# Patient Record
Sex: Female | Born: 1950 | Hispanic: No | Marital: Married | Smoking: Current some day smoker
Health system: Southern US, Community
[De-identification: ages and names within clinical notes are randomized; demographics above are authoritative.]

## PROBLEM LIST (undated history)

## (undated) DIAGNOSIS — F0390 Unspecified dementia without behavioral disturbance: Secondary | ICD-10-CM

## (undated) DIAGNOSIS — I10 Essential (primary) hypertension: Secondary | ICD-10-CM

---

## 2001-09-30 ENCOUNTER — Other Ambulatory Visit: Admission: RE | Admit: 2001-09-30 | Discharge: 2001-09-30 | Payer: Self-pay | Admitting: Family Medicine

## 2007-06-29 ENCOUNTER — Ambulatory Visit: Payer: Self-pay | Admitting: Family Medicine

## 2007-07-06 ENCOUNTER — Ambulatory Visit: Payer: Self-pay | Admitting: Family Medicine

## 2009-04-07 IMAGING — MG MM CAD SCREENING MAMMO
1 series · 5 of 5 positions shown · non-contrast
Comparison: none

REASON FOR EXAM: scr mammo
COMMENTS:

[Series 5434: R CC · right · 5 of 5 slices shown]
[im 1/5]
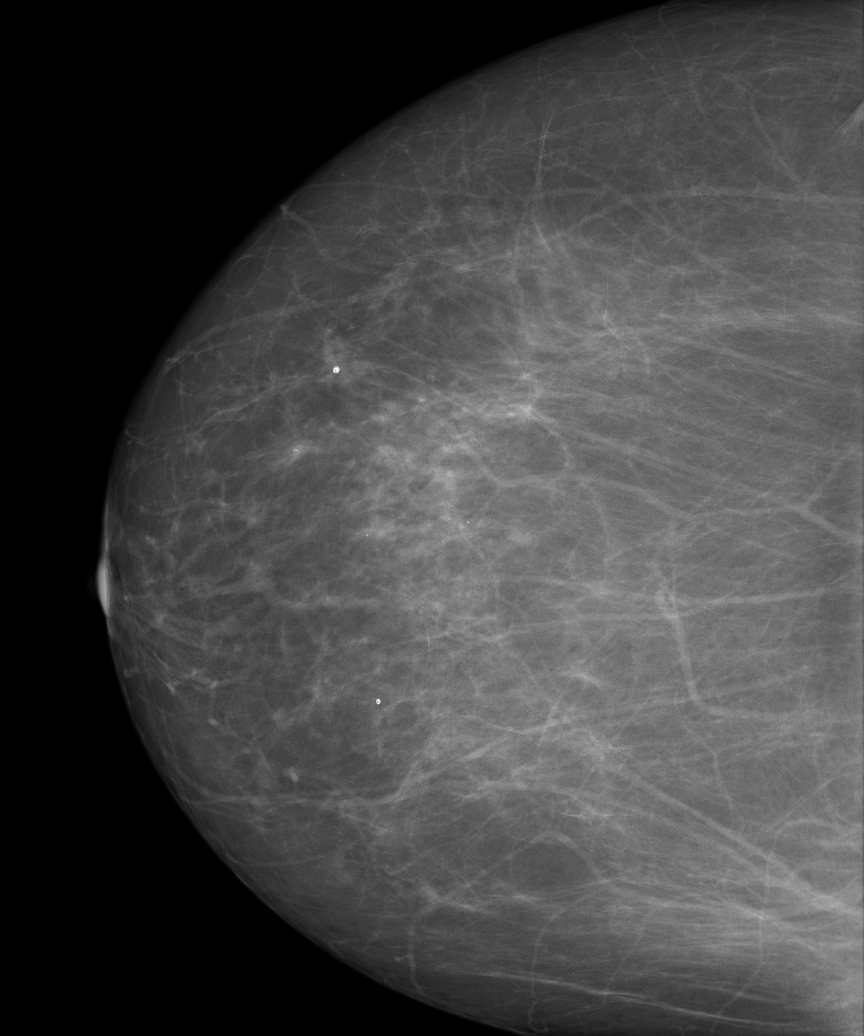
[im 2/5]
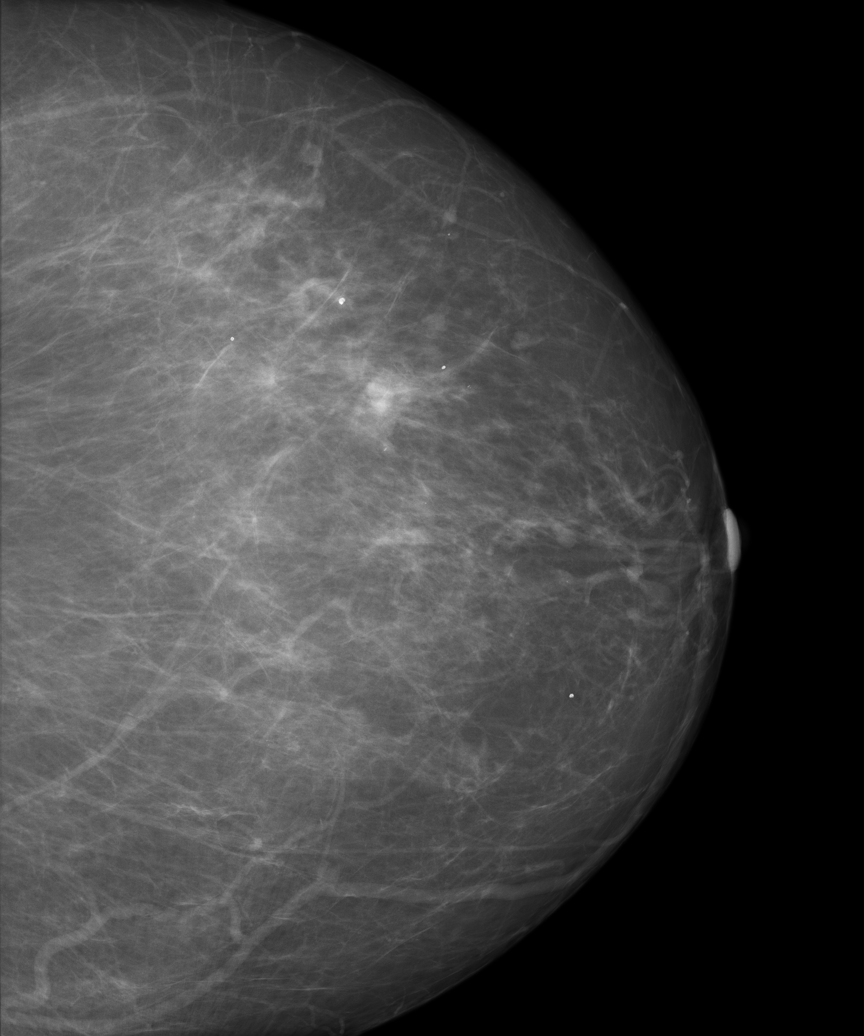
[im 3/5]
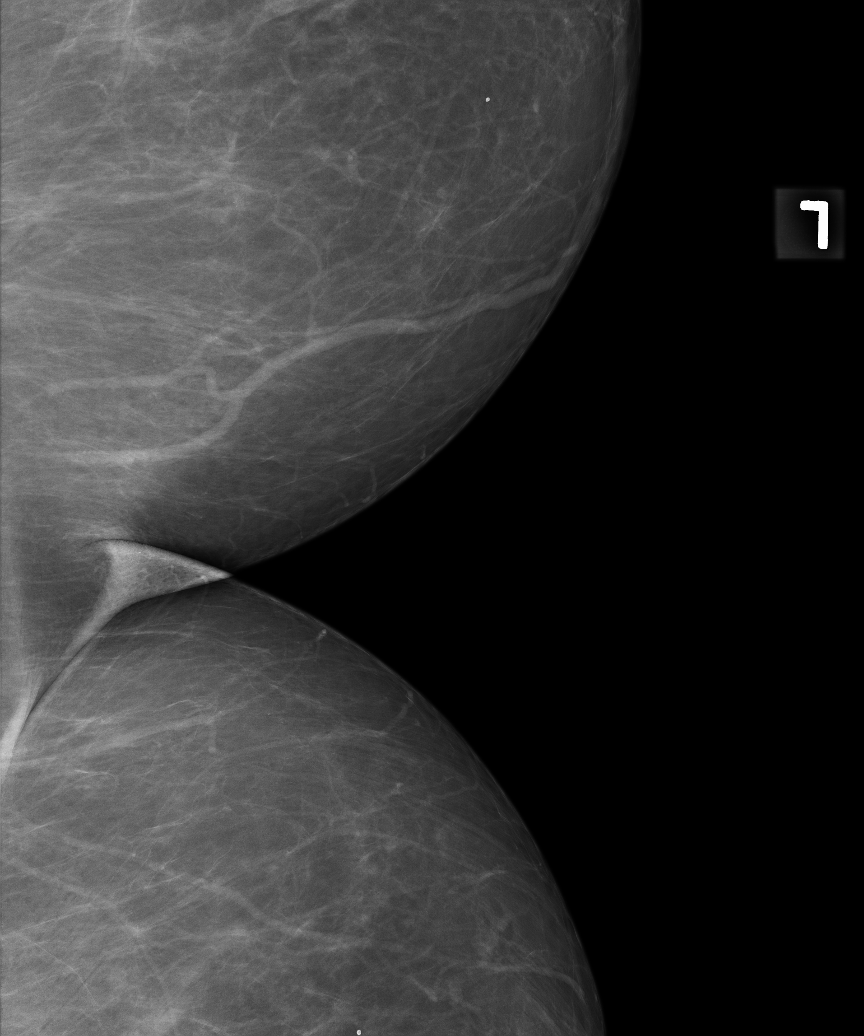
[im 4/5]
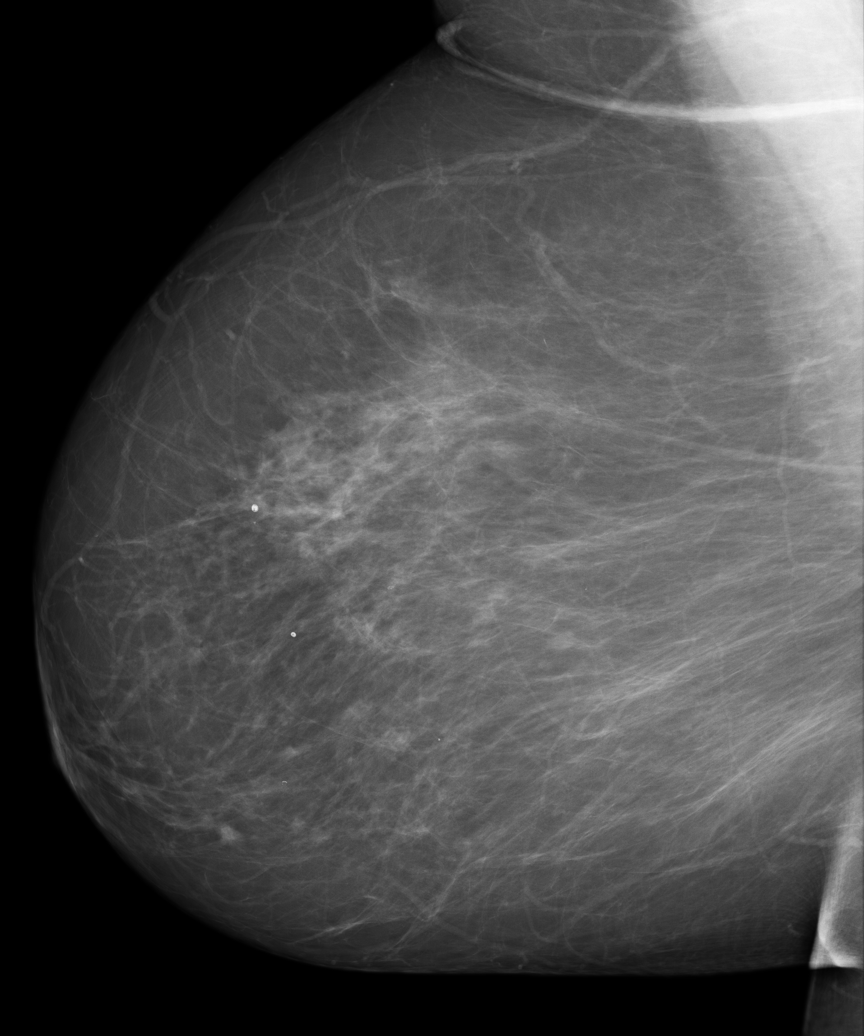
[im 5/5]
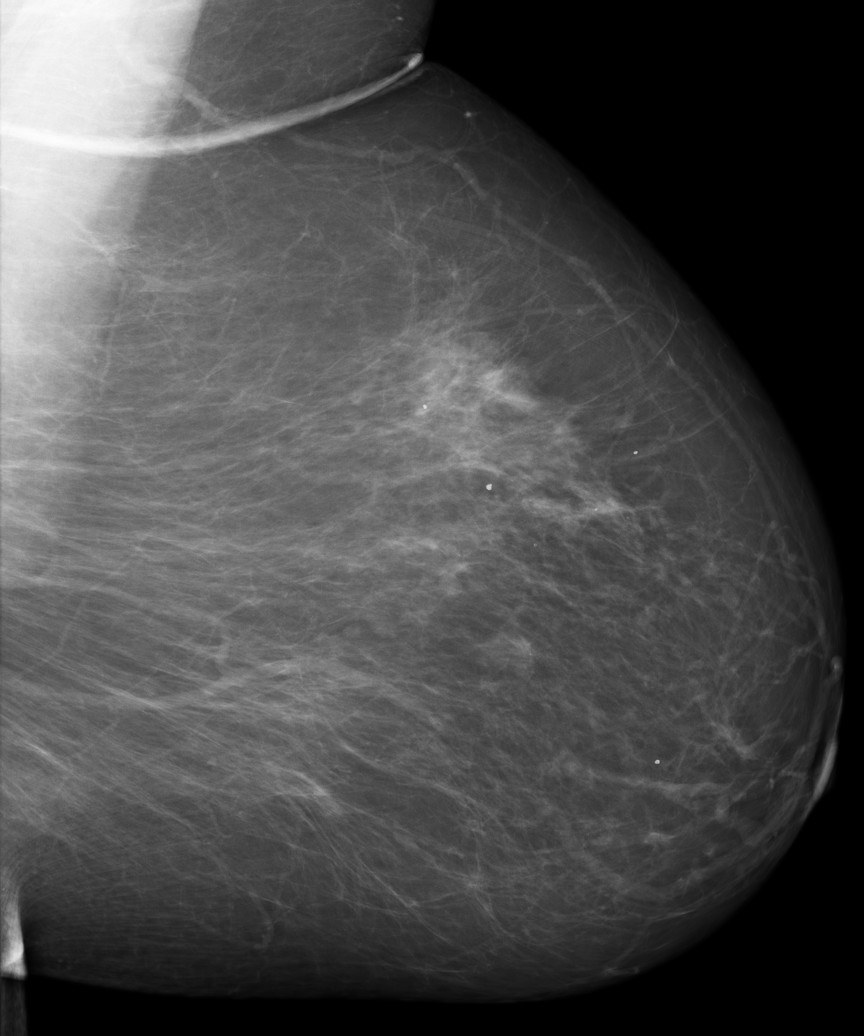

[5 of 5 positions shown; findings below may reference images not displayed]

PROCEDURE:     MAM - MAM DGTL SCREENING MAMMO W/CAD  - June 29, 2007  [DATE]

RESULT:     Comparison is made to prior studies dated 04-14-03 and 04-06-01.

The breasts demonstrate a mild heterogenous parenchymal pattern primarily
centrally within the breasts. An area of asymmetric, mild spiculated density
projects in the superolateral portion of the LEFT breast.  Scattered
calcifications are demonstrated throughout both breasts which appear stable
and benign. No further radiographic evidence to suggest malignancy is
appreciated.
IMPRESSION: 1)Asymmetric density, LEFT breast, additional imaging recommended.

BI-RADS: Category 0: Needs Additional Imaging Evaluation

A NEGATIVE MAMMOGRAM REPORT DOES NOT PRECLUDE BIOPSY OR OTHER EVALUATION OF
A CLINICALLY PALPABLE OR OTHERWISE SUSPICIOUS MASS OR LESION. BREAST CANCER
MAY NOT BE DETECTED BY MAMMOGRAPHY IN UP TO 10% OF CASES.

## 2009-11-17 ENCOUNTER — Ambulatory Visit: Payer: Self-pay | Admitting: Internal Medicine

## 2013-02-17 ENCOUNTER — Ambulatory Visit: Payer: Self-pay | Admitting: Family Medicine

## 2013-04-12 ENCOUNTER — Ambulatory Visit: Payer: Self-pay | Admitting: Family Medicine

## 2013-07-01 ENCOUNTER — Ambulatory Visit: Payer: Self-pay | Admitting: Unknown Physician Specialty

## 2013-07-02 LAB — PATHOLOGY REPORT

## 2013-12-07 ENCOUNTER — Ambulatory Visit: Payer: Self-pay | Admitting: Family Medicine

## 2014-10-19 ENCOUNTER — Other Ambulatory Visit: Payer: Self-pay | Admitting: Neurology

## 2014-10-19 DIAGNOSIS — R413 Other amnesia: Secondary | ICD-10-CM

## 2014-10-28 ENCOUNTER — Ambulatory Visit
Admission: RE | Admit: 2014-10-28 | Discharge: 2014-10-28 | Disposition: A | Discharge status: Home / Self Care | Payer: Medicare Other | Source: Ambulatory Visit | Attending: Neurology | Admitting: Neurology

## 2014-10-28 DIAGNOSIS — R413 Other amnesia: Secondary | ICD-10-CM

## 2014-10-28 DIAGNOSIS — G9389 Other specified disorders of brain: Secondary | ICD-10-CM | POA: Insufficient documentation

## 2015-01-20 IMAGING — MG MM ADDITIONAL VIEWS AT NO CHARGE
1 series · 5 of 5 positions shown · non-contrast
Comparison: With previous exams dated 11/17/2009 and [DATE]

CLINICAL DATA: Abnormal bilateral screening mammogram.

EXAM:
DIGITAL DIAGNOSTIC  BILATERL MAMMOGRAM

[R CC · right · 5 of 5 slices shown]
[im 1/5]
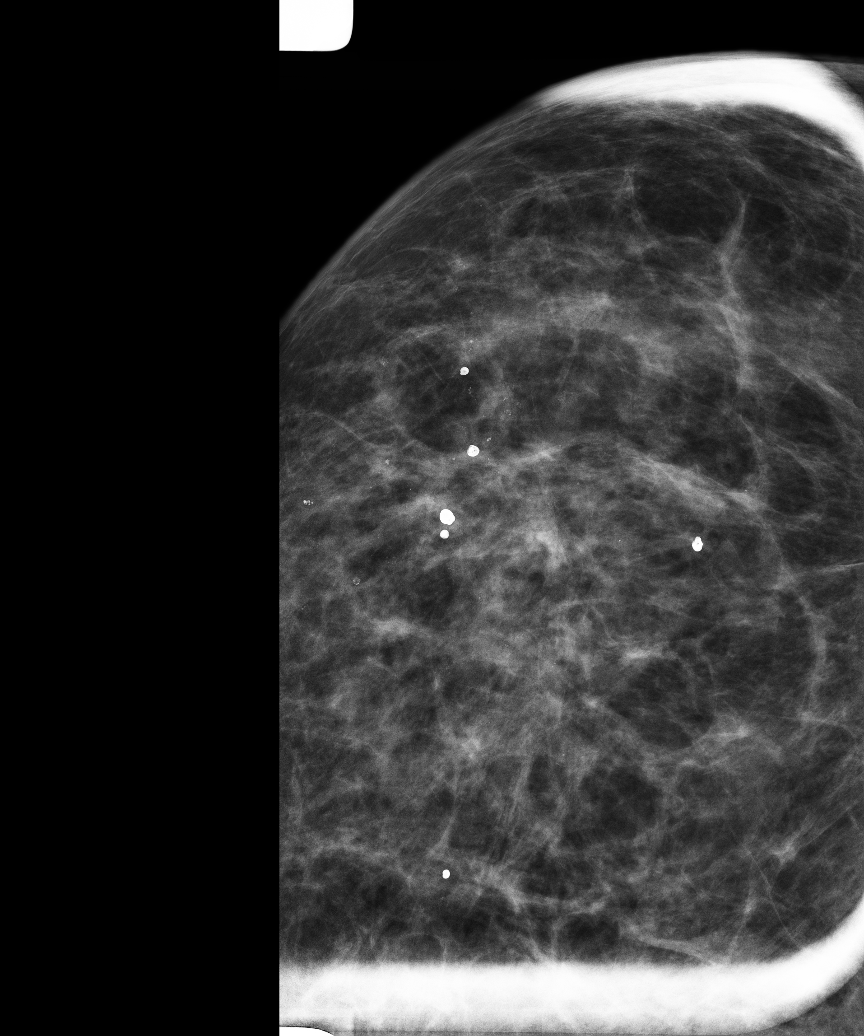
[im 2/5]
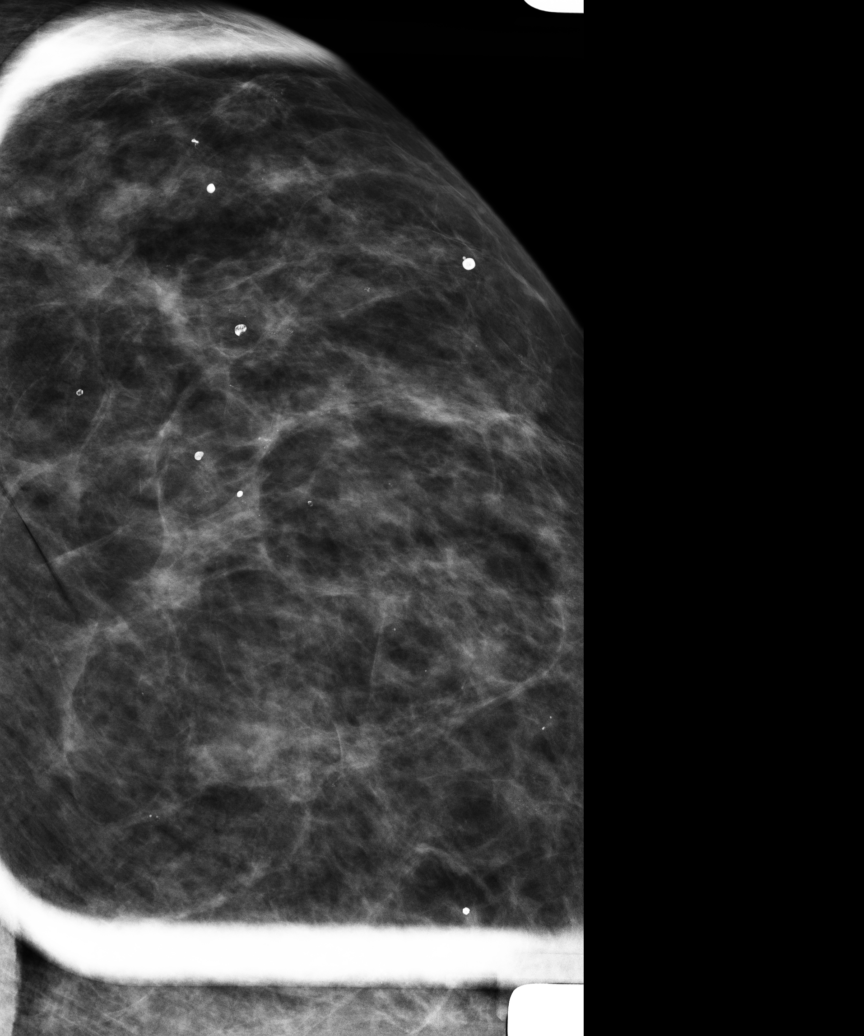
[im 3/5]
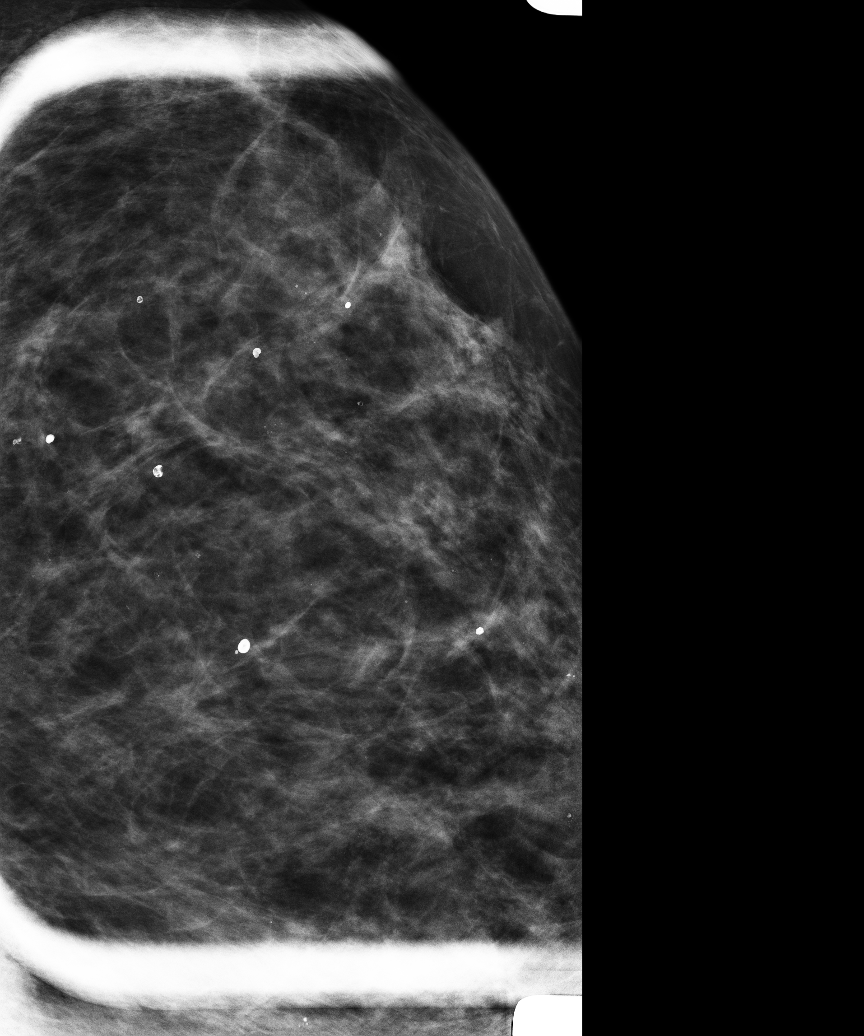
[im 4/5]
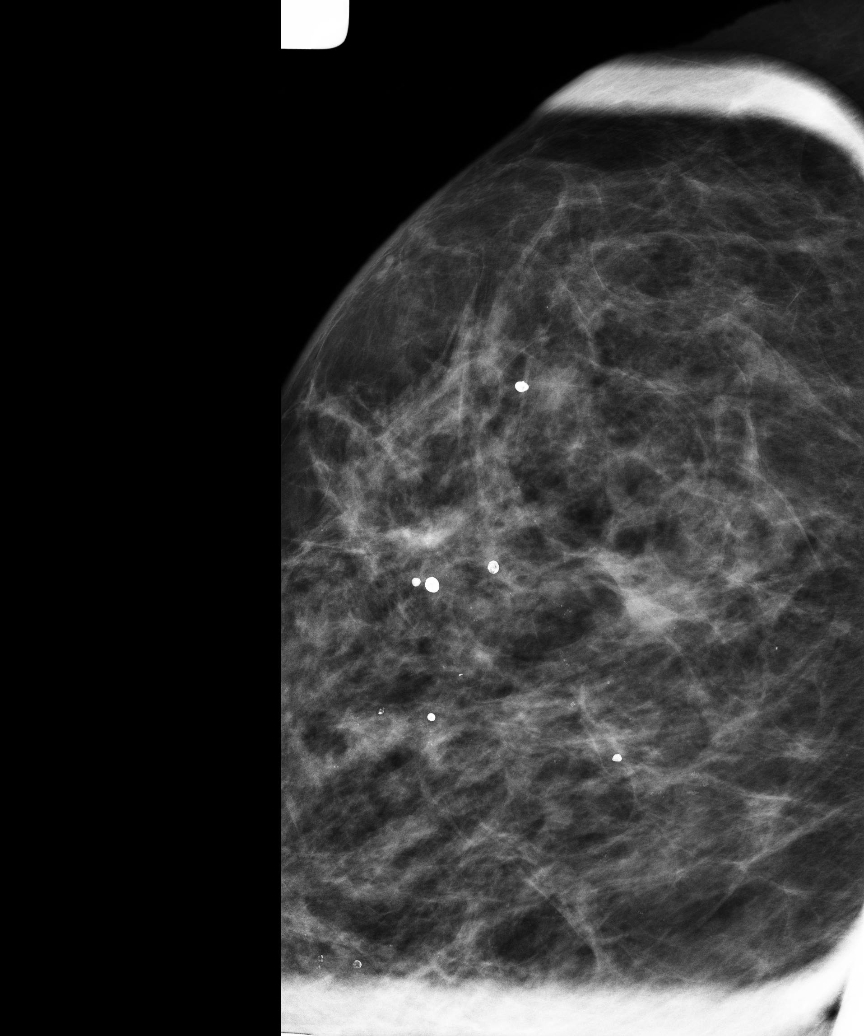
[im 5/5]
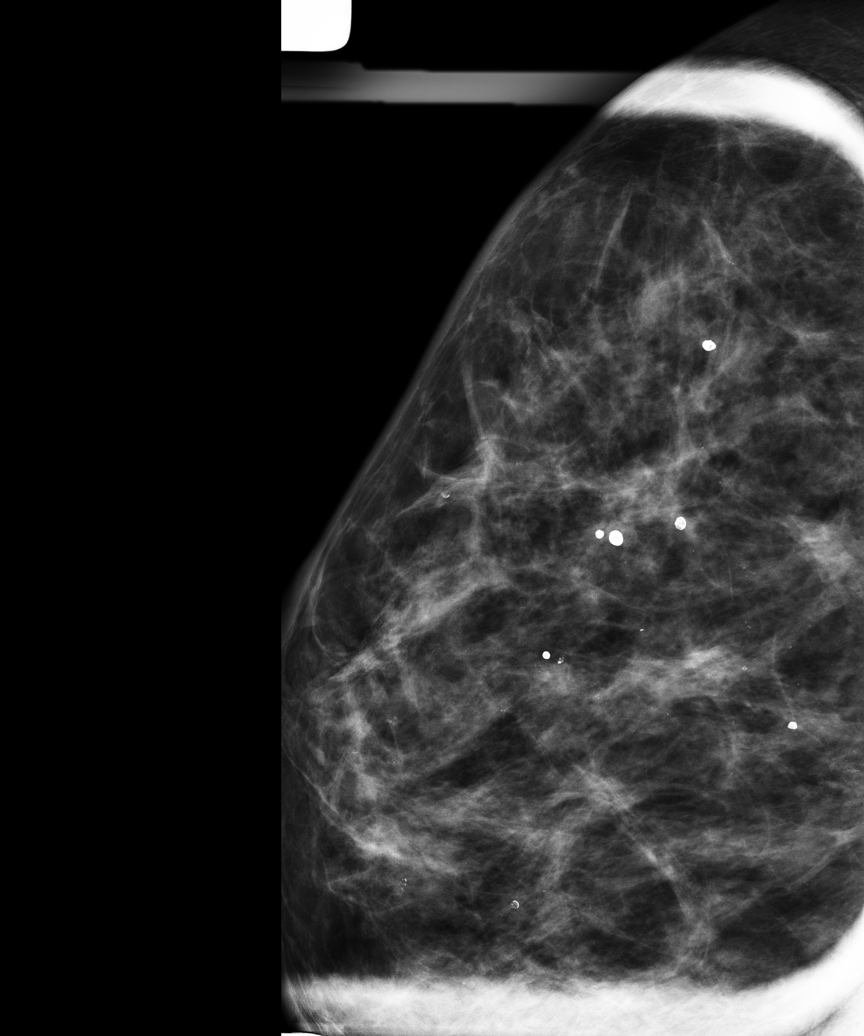

[5 of 5 positions shown; findings below may reference images not displayed]

ACR Breast Density Category b: There are scattered areas of
fibroglandular density.
FINDINGS: Magnification views of the upper-outer quadrant right breast were
performed. There are diffuse punctate calcifications. Most of these
are rounded and some have a differential appearance and may
represent milk of calcium. They are felt to be benign. There are
also diffuse calcifications in the upper-outer quadrant of the left
breast. Once again most of these are punctate and round. Some appear
to have a differential appearance on the lateral view and may also
represent milk of calcium. They are felt to likely be benign.
IMPRESSION: Probable benign calcifications in both breasts.

RECOMMENDATION:
Short-term interval followup mammogram in 6 months is recommended.

I have discussed the findings and recommendations with the patient.
Results were also provided in writing at the conclusion of the
visit. If applicable, a reminder letter will be sent to the patient
regarding the next appointment.

BI-RADS CATEGORY  3: Probably benign finding(s) - short interval
follow-up suggested.

## 2015-09-27 ENCOUNTER — Emergency Department: Payer: Medicare Other

## 2015-09-27 ENCOUNTER — Emergency Department
Admission: EM | Admit: 2015-09-27 | Discharge: 2015-09-27 | Disposition: A | Discharge status: Home / Self Care | Payer: Medicare Other | Attending: Student | Admitting: Student

## 2015-09-27 DIAGNOSIS — F0391 Unspecified dementia with behavioral disturbance: Secondary | ICD-10-CM | POA: Diagnosis not present

## 2015-09-27 DIAGNOSIS — R4182 Altered mental status, unspecified: Secondary | ICD-10-CM | POA: Diagnosis present

## 2015-09-27 DIAGNOSIS — R410 Disorientation, unspecified: Secondary | ICD-10-CM | POA: Insufficient documentation

## 2015-09-27 DIAGNOSIS — E876 Hypokalemia: Secondary | ICD-10-CM | POA: Insufficient documentation

## 2015-09-27 DIAGNOSIS — Z79899 Other long term (current) drug therapy: Secondary | ICD-10-CM | POA: Insufficient documentation

## 2015-09-27 DIAGNOSIS — E860 Dehydration: Secondary | ICD-10-CM | POA: Diagnosis not present

## 2015-09-27 LAB — COMPREHENSIVE METABOLIC PANEL
ALK PHOS: 56 U/L (ref 38–126)
AST: 16 U/L (ref 15–41)
Albumin: 3.8 g/dL (ref 3.5–5.0)
Anion gap: 9 (ref 5–15)
BUN: 13 mg/dL (ref 6–20)
CALCIUM: 8.9 mg/dL (ref 8.9–10.3)
CHLORIDE: 96 mmol/L — AB (ref 101–111)
CO2: 27 mmol/L (ref 22–32)
CREATININE: 1.17 mg/dL — AB (ref 0.44–1.00)
GFR calc non Af Amer: 47 mL/min — ABNORMAL LOW (ref >60)
GFR, EST AFRICAN AMERICAN: 55 mL/min — AB (ref >60)
Glucose, Bld: 126 mg/dL — ABNORMAL HIGH (ref 65–99)
Potassium: 2.9 mmol/L — CL (ref 3.5–5.1)
SODIUM: 132 mmol/L — AB (ref 135–145)
Total Bilirubin: 0.5 mg/dL (ref 0.3–1.2)
Total Protein: 7 g/dL (ref 6.5–8.1)

## 2015-09-27 LAB — CBC WITH DIFFERENTIAL/PLATELET
BASOS PCT: 3 %
Basophils Absolute: 0.3 10*3/uL — ABNORMAL HIGH (ref 0–0.1)
EOS ABS: 0.1 10*3/uL (ref 0–0.7)
EOS PCT: 1 %
HCT: 30.6 % — ABNORMAL LOW (ref 35.0–47.0)
HEMOGLOBIN: 10.3 g/dL — AB (ref 12.0–16.0)
LYMPHS ABS: 0.9 10*3/uL — AB (ref 1.0–3.6)
Lymphocytes Relative: 9 %
MCH: 27.2 pg (ref 26.0–34.0)
MCHC: 33.8 g/dL (ref 32.0–36.0)
MCV: 80.6 fL (ref 80.0–100.0)
MONO ABS: 0.2 10*3/uL (ref 0.2–0.9)
MONOS PCT: 2 %
NEUTROS PCT: 85 %
Neutro Abs: 8.7 10*3/uL — ABNORMAL HIGH (ref 1.4–6.5)
Platelets: 475 10*3/uL — ABNORMAL HIGH (ref 150–440)
RBC: 3.8 MIL/uL (ref 3.80–5.20)
RDW: 14.2 % (ref 11.5–14.5)
WBC: 10.2 10*3/uL (ref 3.6–11.0)

## 2015-09-27 LAB — URINALYSIS COMPLETE WITH MICROSCOPIC (ARMC ONLY)
BILIRUBIN URINE: NEGATIVE
Bacteria, UA: NONE SEEN
GLUCOSE, UA: NEGATIVE mg/dL
HGB URINE DIPSTICK: NEGATIVE
KETONES UR: NEGATIVE mg/dL
LEUKOCYTES UA: NEGATIVE
NITRITE: NEGATIVE
Protein, ur: NEGATIVE mg/dL
RBC / HPF: NONE SEEN RBC/hpf (ref 0–5)
Specific Gravity, Urine: 1.005 (ref 1.005–1.030)
pH: 6 (ref 5.0–8.0)

## 2015-09-27 LAB — TROPONIN I

## 2015-09-27 LAB — ETHANOL: Alcohol, Ethyl (B): <5 mg/dL (ref <5)

## 2015-09-27 LAB — GLUCOSE, CAPILLARY: Glucose-Capillary: 108 mg/dL — ABNORMAL HIGH (ref 65–99)

## 2015-09-27 MED ORDER — POTASSIUM CHLORIDE CRYS ER 20 MEQ PO TBCR
40.0000 meq | EXTENDED_RELEASE_TABLET | Freq: Once | ORAL | Status: AC
  Administered 2015-09-27: 40 meq via ORAL
  Filled 2015-09-27: qty 2

## 2015-09-27 MED ORDER — SODIUM CHLORIDE 0.9 % IV BOLUS (SEPSIS)
500.0000 mL | Freq: Once | INTRAVENOUS | Status: AC
  Administered 2015-09-27: 500 mL via INTRAVENOUS

## 2015-09-27 NOTE — ED Notes (Addendum)
Patient transported to X-ray and CT 

## 2015-09-27 NOTE — ED Provider Notes (Signed)
[facility] Emergency Department Provider Note  ____________________________________________  Time seen: Approximately 7:05 AM  I have reviewed the triage vital signs and the nursing notes.   HISTORY  Chief Complaint Altered Mental Status    HPI Holly Rocha is a 65 y.o. female with history of dementia, hypertension, hyperlipidemia who presents for evaluation of increased confusion since yesterday, gradual onset, intermittent, no modifying factors, mild. Patient's daughter is at bedside and provides the majority of the history. According to her daughter, patient  was seen by her primary care doctor yesterday and was told that she may been dehydrated based on some lab work that was performed. Last night, the patient was more confused, pacing the house, would not sleep. Patient has had cough but no fevers. She denies any chest pain or difficulty breathing. No vomiting, diarrhea, fevers or chills.    No past medical history on file.  There are no active problems to display for this patient.   No past surgical history on file.  Current Outpatient Rx  Name  Route  Sig  Dispense  Refill  . amLODipine (NORVASC) 5 MG tablet   Oral   Take 1 tablet by mouth daily.      1   . aspirin EC 81 MG tablet   Oral   Take 1 tablet by mouth daily.         Marland Kitchen donepezil (ARICEPT) 10 MG tablet   Oral   Take 1 tablet by mouth daily.      0   . losartan-hydrochlorothiazide (HYZAAR) 100-25 MG tablet   Oral   Take 1 tablet by mouth daily.      0   . lovastatin (MEVACOR) 40 MG tablet   Oral   Take 1 tablet by mouth daily with supper.      0     Allergies Penicillins  No family history on file.  Social History Social History  Substance Use Topics  . Smoking status: Not on file  . Smokeless tobacco: Not on file  . Alcohol Use: Not on file    Review of Systems Constitutional: No fever/chills Eyes: No visual changes. ENT: No sore  throat. Cardiovascular: Denies chest pain. Respiratory: Denies shortness of breath. Gastrointestinal: No abdominal pain.  No nausea, no vomiting.  No diarrhea.  No constipation. Genitourinary: Negative for dysuria. Musculoskeletal: Negative for back pain. Skin: Negative for rash. Neurological: Negative for headaches, focal weakness or numbness.  10-point ROS otherwise negative.  ____________________________________________   PHYSICAL EXAM:  VITAL SIGNS: ED Triage Vitals  Enc Vitals Group     BP 09/27/15 0637 155/60 mmHg     Pulse Rate 09/27/15 0637 83     Resp 09/27/15 0637 20     Temp 09/27/15 0637 98.1 F (36.7 C)     Temp Source 09/27/15 0637 Oral     SpO2 09/27/15 0637 100 %     Weight 09/27/15 0637 101 lb (45.813 kg)     Height 09/27/15 0637  (1.549 m)     Head Cir --      Peak Flow --      Pain Score --      Pain Loc --      Pain Edu? --      Excl. in GC? --     Constitutional: Alert and oriented x 3. Well appearing and in no acute distress. Eyes: Conjunctivae are normal. PERRL. EOMI. Head: Atraumatic. Nose: No congestion/rhinnorhea. Mouth/Throat: Mucous membranes are moist.  Oropharynx non-erythematous. Neck: No stridor.  Supple without meningismus. Cardiovascular: Normal rate, regular rhythm. Grossly normal heart sounds.  Good peripheral circulation. Respiratory: Normal respiratory effort.  No retractions. Lungs CTAB. Gastrointestinal: Soft and nontender. No distention. Marland Kitchen. No CVA tenderness. Genitourinary: deferred Musculoskeletal: No lower extremity tenderness nor edema.  No joint effusions. Neurologic:  Normal speech and language. No gross focal neurologic deficits are appreciated. No gait instability. 5 out of 5 strength in bilateral upper and lower extremities, sensation intact to light touch throughout, cranial nerves II through XII intact. Skin:  Skin is warm, dry and intact. No rash noted. Psychiatric: Mood and affect are normal. Speech and behavior  are normal.  ____________________________________________   LABS (all labs ordered are listed, but only abnormal results are displayed)  Labs Reviewed  COMPREHENSIVE METABOLIC PANEL - Abnormal; Notable for the following:    Sodium 132 (*)    Potassium 2.9 (*)    Chloride 96 (*)    Glucose, Bld 126 (*)    Creatinine, Ser 1.17 (*)    ALT <5 (*)    GFR calc non Af Amer 47 (*)    GFR calc Af Amer 55 (*)    All other components within normal limits  CBC WITH DIFFERENTIAL/PLATELET - Abnormal; Notable for the following:    Hemoglobin 10.3 (*)    HCT 30.6 (*)    Platelets 475 (*)    Neutro Abs 8.7 (*)    Lymphs Abs 0.9 (*)    Basophils Absolute 0.3 (*)    All other components within normal limits  GLUCOSE, CAPILLARY - Abnormal; Notable for the following:    Glucose-Capillary 108 (*)    All other components within normal limits  URINALYSIS COMPLETEWITH MICROSCOPIC ([facility] ONLY) - Abnormal; Notable for the following:    Color, Urine STRAW (*)    APPearance CLEAR (*)    Squamous Epithelial / LPF 0-5 (*)    All other components within normal limits  TROPONIN I  ETHANOL   ____________________________________________  EKG  ED ECG REPORT I, Gayla DossGayle, Helton Oleson A, the attending physician, personally viewed and interpreted this ECG.   Date: 09/27/2015  EKG Time: 07:52  Rate: 75  Rhythm:Sinus rhythm with borderline and prolonged PR interval/first-degree AV block  Axis: normal  Intervals:none  ST&T Change: No acute ST elevation.  ____________________________________________  RADIOLOGY  CT head IMPRESSION: Mild atrophy and microvascular ischemic disease without acute intracranial process.   CXR IMPRESSION: No active cardiopulmonary disease. ____________________________________________   PROCEDURES  Procedure(s) performed: None  Critical Care performed: No  ____________________________________________   INITIAL IMPRESSION / ASSESSMENT AND PLAN / ED COURSE  Pertinent  labs & imaging results that were available during my care of the patient were reviewed by me and considered in my medical decision making (see chart for details).  Holly Rocha is a 65 y.o. female with history of dementia, hypertension, hyperlipidemia who presents for evaluation of increased confusion since yesterday. On exam, she is very well-appearing and in no acute distress. Vital signs stable, she is afebrile. She has a benign physical examination as well as an intact neurological examination. She is alert and oriented 3 but not situation however daughter reports that this is her baseline mental status. We'll obtain screening labs, EKG, chest x-ray, CT head, urinalysis. Will give light fluid bolus. Reassess for disposition.  ----------------------------------------- 9:05 AM on 09/27/2015 ----------------------------------------- Labs reviewed. CBC with mild anemia, hemoglobin 10.3 however this appears to be her baseline. CMP with mild hyponatremia, hypokalemia. Sodium 132,  potassium 2.9, creatinine also mildly elevated at 1.17, suspected symptoms may be related to mild dehydration superimposed on her chronic dementia. She received an IV fluid bolus and potassium was repleted orally. Troponin negative. Urinalysis is not consistent with infection. CT head and chest x-ray showed no acute abnormality. Discussed the need for increased oral fluid intake. We also discussed meticulous return precautions, need for close PCP follow-up within the next 1-2 days for recheck of potassium and creatinine. Her daughter at bedside voices understanding and is comfortable with the discharge plan. DC home.  ____________________________________________   FINAL CLINICAL IMPRESSION(S) / ED DIAGNOSES  Final diagnoses:  Confusion  Dehydration  Hypokalemia  Dementia, with behavioral disturbance      Gayla Doss, MD 09/27/15 [phone]

## 2015-09-27 NOTE — Discharge Instructions (Signed)
As we discussed, Holly Rocha needs to follow-up with her primary care doctor within the next 1-2 days for recheck of her potassium, sodium and kidney function labs. Please call his office to schedule an appointment for her to be seen. She should return immediately to the emergency department if she has significant worsening of confusion, dizziness, headache, vomiting, diarrhea, fevers, chills, numbness, weakness, chest pain, difficulty breathing, or for any other concerns.

## 2015-09-27 NOTE — ED Notes (Signed)
Patient ambulatory to triage with steady gait, without difficulty or distress noted; pt accomp by daughter who reports increased confusion; hx alzheimer's; seen PCP yesterday who st ?dehydration

## 2016-01-02 ENCOUNTER — Emergency Department: Payer: Medicare Other

## 2016-01-02 ENCOUNTER — Encounter: Payer: Self-pay | Admitting: Emergency Medicine

## 2016-01-02 ENCOUNTER — Emergency Department
Admission: EM | Admit: 2016-01-02 | Discharge: 2016-01-02 | Disposition: A | Discharge status: Home / Self Care | Payer: Medicare Other | Attending: Emergency Medicine | Admitting: Emergency Medicine

## 2016-01-02 DIAGNOSIS — R2242 Localized swelling, mass and lump, left lower limb: Secondary | ICD-10-CM | POA: Diagnosis present

## 2016-01-02 DIAGNOSIS — L03116 Cellulitis of left lower limb: Secondary | ICD-10-CM | POA: Insufficient documentation

## 2016-01-02 DIAGNOSIS — F17200 Nicotine dependence, unspecified, uncomplicated: Secondary | ICD-10-CM | POA: Diagnosis not present

## 2016-01-02 DIAGNOSIS — Z7982 Long term (current) use of aspirin: Secondary | ICD-10-CM | POA: Insufficient documentation

## 2016-01-02 DIAGNOSIS — I10 Essential (primary) hypertension: Secondary | ICD-10-CM | POA: Diagnosis not present

## 2016-01-02 HISTORY — DX: Essential (primary) hypertension: I10

## 2016-01-02 HISTORY — DX: Unspecified dementia, unspecified severity, without behavioral disturbance, psychotic disturbance, mood disturbance, and anxiety: F03.90

## 2016-01-02 MED ORDER — CLINDAMYCIN HCL 300 MG PO CAPS: 300.0000 mg | ORAL_CAPSULE | Freq: Four times a day (QID) | ORAL | Status: DC

## 2016-01-02 NOTE — Discharge Instructions (Signed)
Please seek medical attention for any high fevers, chest pain, shortness of breath, change in behavior, persistent vomiting, bloody stool or any other new or concerning symptoms. ° ° °Cellulitis °Cellulitis is an infection of the skin and the tissue under the skin. The infected area is usually red and tender. This happens most often in the arms and lower legs. °HOME CARE  °· Take your antibiotic medicine as told. Finish the medicine even if you start to feel better. °· Keep the infected arm or leg raised (elevated). °· Put a warm cloth on the area up to 4 times per day. °· Only take medicines as told by your doctor. °· Keep all doctor visits as told. °GET HELP IF: °· You see red streaks on the skin coming from the infected area. °· Your red area gets bigger or turns a dark color. °· Your bone or joint under the infected area is painful after the skin heals. °· Your infection comes back in the same area or different area. °· You have a puffy (swollen) bump in the infected area. °· You have new symptoms. °· You have a fever. °GET HELP RIGHT AWAY IF:  °· You feel very sleepy. °· You throw up (vomit) or have watery poop (diarrhea). °· You feel sick and have muscle aches and pains. °  °This information is not intended to replace advice given to you by your health care provider. Make sure you discuss any questions you have with your health care provider. °  °Document Released: 11/20/2007 Document Revised: 02/22/2015 Document Reviewed: 08/19/2011 °Elsevier Interactive Patient Education ©2016 [employer] ° °

## 2016-01-02 NOTE — ED Notes (Signed)
Pt with left foot swelling since Saturday. Pt denies any other sx at this time.

## 2016-01-02 NOTE — ED Provider Notes (Signed)
[facility] Emergency Department Provider Note    ____________________________________________  Time seen: ~1730  I have reviewed the triage vital signs and the nursing notes.   HISTORY  Chief Complaint Leg Swelling   History limited by: Not Limited   HPI Holly Rocha is a 65 y.o. female who presents to the emergency department today because of concerns for left foot swelling, redness and warmth. She noticed these symptoms yesterday. She has had some very mild pain associated with the left fifth digit. She has noticed some swelling in both shins. She denies any trauma to her foot. Denies any crush injury. Denies similar symptoms in the past. The patient says that she has not had any fevers, nausea vomiting, chest pain or palpitation.   Past Medical History  Diagnosis Date  . Hypertension   . Dementia     There are no active problems to display for this patient.   History reviewed. No pertinent past surgical history.  Current Outpatient Rx  Name  Route  Sig  Dispense  Refill  . amLODipine (NORVASC) 5 MG tablet   Oral   Take 1 tablet by mouth daily.      1   . aspirin EC 81 MG tablet   Oral   Take 1 tablet by mouth daily.         Marland Kitchen. donepezil (ARICEPT) 10 MG tablet   Oral   Take 1 tablet by mouth daily.      0   . losartan-hydrochlorothiazide (HYZAAR) 100-25 MG tablet   Oral   Take 1 tablet by mouth daily.      0   . lovastatin (MEVACOR) 40 MG tablet   Oral   Take 1 tablet by mouth daily with supper.      0     Allergies Penicillins  No family history on file.  Social History Social History  Substance Use Topics  . Smoking status: Current Some Day Smoker  . Smokeless tobacco: None  . Alcohol Use: No    Review of Systems  Constitutional: Negative for fever. Cardiovascular: Negative for chest pain. Respiratory: Negative for shortness of breath. Gastrointestinal: Negative for abdominal pain, vomiting and  diarrhea. Neurological: Negative for headaches, focal weakness or numbness.  10-point ROS otherwise negative.  ____________________________________________   PHYSICAL EXAM:  VITAL SIGNS: ED Triage Vitals  Enc Vitals Rocha     BP 01/02/16 1616 173/46 mmHg     Pulse Rate 01/02/16 1616 76     Resp 01/02/16 1616 18     Temp 01/02/16 1616 98.4 F (36.9 C)     Temp Source 01/02/16 1616 Oral     SpO2 01/02/16 1616 100 %   Constitutional: Alert and oriented. Well appearing and in no distress. Eyes: Conjunctivae are normal. PERRL. Normal extraocular movements. ENT   Head: Normocephalic and atraumatic.   Nose: No congestion/rhinnorhea.   Mouth/Throat: Mucous membranes are moist.   Neck: No stridor. Hematological/Lymphatic/Immunilogical: No cervical lymphadenopathy. Cardiovascular: Normal rate, regular rhythm.  No murmurs, rubs, or gallops. Respiratory: Normal respiratory effort without tachypnea nor retractions. Breath sounds are clear and equal bilaterally. No wheezes/rales/rhonchi. Gastrointestinal: Soft and nontender. No distention. There is no CVA tenderness. Genitourinary: Deferred Musculoskeletal: Normal range of motion in all extremities. No joint effusions.  Bilateral trace pitting edema. The left foot does have more swelling with erythema overlying the dorsal aspect. It is warm to touch. Neurologic:  Normal speech and language. No gross focal neurologic deficits are appreciated.  Skin:  Skin is warm, dry and intact. No rash noted. Psychiatric: Mood and affect are normal. Speech and behavior are normal. Patient exhibits appropriate insight and judgment.  ____________________________________________    LABS (pertinent positives/negatives)  None  ____________________________________________   EKG  None  ____________________________________________    RADIOLOGY  Left foot IMPRESSION: Mild to moderate soft tissue swelling at the left ankle and  midfoot. No fracture, dislocation or advanced arthropathy.  ____________________________________________   PROCEDURES  Procedure(s) performed: None  Critical Care performed: No  ____________________________________________   INITIAL IMPRESSION / ASSESSMENT AND PLAN / ED COURSE  Pertinent labs & imaging results that were available during my care of the patient were reviewed by me and considered in my medical decision making (see chart for details).  Patient presents to the emergency family today because of concerns for left foot swelling, redness and warmth. On exam the foot does have erythema and warmth to it. X-ray was obtained which did not show any acute abnormality. Will plan on discharging home and treating for cellulitis. In terms of the trace edema patient was taken off her hydrochlorothiazide. Will have patient discussed with PCP.  ____________________________________________   FINAL CLINICAL IMPRESSION(S) / ED DIAGNOSES  Final diagnoses:  Cellulitis of left lower extremity     Note: This dictation was prepared with Dragon dictation. Any transcriptional errors that result from this process are unintentional    Phineas Semen, MD 01/02/16 1904
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
## 2016-05-29 NOTE — Progress Notes (Signed)
 Established Patient Visit   Chief Complaint: Chief Complaint  Patient presents with  . Follow-up    4 mo  . Hypertension  . Hyperlipidemia  . Cardiac Valve Problem  . Chronic Kidney Disease  . Nicotine Dependence   Date of Service: 05/29/2016 Date of Birth: 06-May-1951 PCP: JERONA DARILYN SAYRE, MD  History of Present Illness: [name] is a 65 y.o.female patient who comes in for follow-up, accompanied by her daughter, and she states that she has continued with her usual physical activities, with no episodes of chest pain, dyspnea or other cardiovascular symptoms at this time.  Her weight is up an additional 10 pounds, and she attributes this to dietary indiscretion as well as less walking, and we discussed the importance of a regular walking program as well as DASH diet to avoid further weight gain.  Her blood pressure is elevated today, and upon recheck it remains elevated.  For this reason we will increase amlodipine to 5 mg daily, and she and her daughter have agreed to monitor blood pressure readings closely at home.  She is also scheduled to see her PCP within the next 2 weeks.  She has continued with lovastatin for dyslipidemia, with recent fasting lipid profile showing total cholesterol 225, HDL 85, LDL 129, with liver enzymes within normal limits, and again her increase in total cholesterol and LDL are likely due to dietary indiscretion.  His recent creatinine is 1.5, and we discussed the importance of avoiding nephrotoxic agents such as NSAIDs, and the patient and her daughter verbalized understanding.  Echocardiogram performed in 2015 has shown normal LV function with EF 45-50% and moderate aortic regurgitation with mild aortic stenosis, and mild mitral, tricuspid and pulmonic regurgitation.  Past Medical and Surgical History  Past Medical History Past Medical History:  Diagnosis Date  . Arthritis   . B12 deficiency   . Chronic kidney disease   . Dementia   . Encephalomalacia    . Heart murmur, unspecified   . Hyperlipidemia   . Hypertension   . Migraines   . Mild aortic stenosis     Past Surgical History She has no past surgical history on file.   Medications and Allergies  Current Medications  Current Outpatient Prescriptions  Medication Sig Dispense Refill  . amLODIPine (NORVASC) 2.5 MG tablet Take 2 tablets (5 mg total) by mouth once daily.    SABRA aspirin 81 MG EC tablet Take 81 mg by mouth once daily.    . carvedilol (COREG) 3.125 MG tablet Take 1 tablet (3.125 mg total) by mouth 2 (two) times daily with meals. 60 tablet 11  . cyanocobalamin (VITAMIN B12) 1000 MCG tablet Take 1,000 mcg by mouth once daily.    SABRA donepezil (ARICEPT) 10 MG tablet Take 1 tablet (10 mg total) by mouth nightly. 30 tablet 11  . losartan (COZAAR) 100 MG tablet Take 1 tablet (100 mg total) by mouth once daily. 90 tablet 3  . lovastatin (MEVACOR) 40 MG tablet take 1 tablet by mouth once daily with dinner 90 tablet 3   No current facility-administered medications for this visit.     Allergies: Penicillins; Ace inhibitors; Codeine phosphate (bulk); Lisinopril (bulk); and Penicillin g pot in dextrose  Social and Family History  Social History  reports that she has been smoking Cigarettes.  She started smoking about 37 years ago. She has a 20.00 pack-year smoking history. She has never used smokeless tobacco. She reports that she does not drink alcohol or use drugs.  Family History Family History  Problem Relation Age of Onset  . Coronary Artery Disease (Blocked arteries around heart) Mother   . Alzheimer's disease Father   . Dementia Father   . Alzheimer's disease Sister   . Dementia Sister   . Hyperlipidemia (Elevated cholesterol) Brother   . High blood pressure (Hypertension) Brother     Review of Systems   Review of Systems: Positive for 10 pounds weight gain, no weakness or fatigue, vision change or hearing loss, cough or congestion, nausea or vomiting, diarrhea,  melena or stomach pain, leg weakness or pain, leg blood clots or cramping, headache or blackouts, dizziness, trouble swallowing, skin rashes or sores, muscle weakness or numbness, anxiety or depression  Physical Examination   Vitals:BP 160/80   Pulse 80   Resp 14   Ht 154.9 cm (5' 1)   Wt 59 kg (130 lb)   LMP  (LMP Unknown)   BMI 24.56 kg/m  Ht:154.9 cm (5' 1) Wt:59 kg (130 lb) ADJ:Anib surface area is 1.59 meters squared. Body mass index is 24.56 kg/m. Appearance: well appearing in no acute distress HEENT: Pupils equally reactive to light and accomodation without xantholasma  Neck: Supple without thyromegaly  Lungs: clear to auscultation bilaterally; no wheezes, rales, rhonchi Heart: Regular rate and rhythm. Normal S1 S2, with a grade 3/6 murmur heard throughout.  No gallop or rub, PMI in normal place and size. carotid upstroke normal without bruit. Jugular venous pressure is normal Abdomen: soft nontender, nondistended, with normal bowel sounds. Abdominal aorta is normal size without bruit Extremities: no cyanosis, clubbing, or edema Peripheral Pulses: 2+ in all extremities, 2+ femoral pulses bilaterally Musculoskeletal;  Normal muscle tone without kyphosis Neurological:Oriented to time, place, and person with normal mood and affect  Assessment   65 y.o. female with  1. Benign essential hypertension   2. Mild aortic stenosis   3. Moderate mitral insufficiency   4. CKD (chronic kidney disease) stage 3, GFR 30-59 ml/min   5. Hyperlipidemia, mixed   6. SOB (shortness of breath)    Now with elevated blood pressure, needing further treatment options.     Plan  -Continue current medical regimen for hypertension control, will increase amlodipine to 5 mg daily for goal blood pressure of 140/90 or less. -The patient and her daughter have agreed to monitor blood pressure readings closely at home, and the patient is also scheduled to see her PCP within the next 2 weeks. -We had a  long discussion regarding the importance of adhering to DASH diet for blood pressure control as well as weight control. -We have had a long discussion and recommendation of an exercise program including minimal activity on a daily basis between 3-5 times per week.  We have suggested walking as that the easiest planned to implement.  The patient understands and agrees to begin working on this at this time  -Continue moderate to high intensive cholesterol therapy for further future risk reduction in cardiovascular disease  -Continue to monitor kidney function closely, and patient was counseled on the importance of avoiding nephrotoxic agents such as NSAIDs. -No further cardiac intervention of valvular heart disease on appropriate medication management and clinically stable at this time    No orders of the defined types were placed in this encounter.   Return in about 3 months (around 08/27/2016).  BONNIE   JEAN MELVILLE, NP

## 2016-12-19 ENCOUNTER — Other Ambulatory Visit: Payer: Self-pay | Admitting: Family Medicine

## 2016-12-19 DIAGNOSIS — R921 Mammographic calcification found on diagnostic imaging of breast: Secondary | ICD-10-CM
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
## 2017-01-30 ENCOUNTER — Ambulatory Visit
Admission: RE | Admit: 2017-01-30 | Discharge: 2017-01-30 | Disposition: A | Discharge status: Home / Self Care | Payer: Medicare Other | Source: Ambulatory Visit | Attending: Family Medicine | Admitting: Family Medicine

## 2017-01-30 DIAGNOSIS — N6002 Solitary cyst of left breast: Secondary | ICD-10-CM | POA: Diagnosis not present

## 2017-01-30 DIAGNOSIS — R921 Mammographic calcification found on diagnostic imaging of breast: Secondary | ICD-10-CM

## 2018-05-26 ENCOUNTER — Other Ambulatory Visit: Payer: Self-pay | Admitting: Family

## 2018-05-26 DIAGNOSIS — Z1231 Encounter for screening mammogram for malignant neoplasm of breast: Secondary | ICD-10-CM

## 2018-06-16 ENCOUNTER — Other Ambulatory Visit: Payer: Self-pay | Admitting: Family

## 2018-06-16 ENCOUNTER — Ambulatory Visit
Admission: RE | Admit: 2018-06-16 | Discharge: 2018-06-16 | Disposition: A | Discharge status: Home / Self Care | Payer: No Typology Code available for payment source | Source: Ambulatory Visit | Attending: Family | Admitting: Family

## 2018-06-16 DIAGNOSIS — Z1231 Encounter for screening mammogram for malignant neoplasm of breast: Secondary | ICD-10-CM | POA: Insufficient documentation

## 2019-08-02 ENCOUNTER — Other Ambulatory Visit: Payer: Self-pay | Admitting: Family

## 2019-08-02 DIAGNOSIS — Z1231 Encounter for screening mammogram for malignant neoplasm of breast: Secondary | ICD-10-CM

## 2020-07-05 ENCOUNTER — Other Ambulatory Visit: Payer: Self-pay

## 2020-07-05 ENCOUNTER — Ambulatory Visit
Admission: RE | Admit: 2020-07-05 | Discharge: 2020-07-05 | Disposition: A | Discharge status: Home / Self Care | Payer: Medicare (Managed Care) | Source: Ambulatory Visit | Attending: Family | Admitting: Family

## 2020-07-05 DIAGNOSIS — Z1231 Encounter for screening mammogram for malignant neoplasm of breast: Secondary | ICD-10-CM | POA: Insufficient documentation

## 2020-08-21 ENCOUNTER — Other Ambulatory Visit: Payer: Self-pay | Admitting: Family

## 2020-08-21 DIAGNOSIS — Z1231 Encounter for screening mammogram for malignant neoplasm of breast: Secondary | ICD-10-CM

## 2022-04-14 IMAGING — MG DIGITAL SCREENING BILAT W/ TOMO W/ CAD
8 of 14 series · 8 of 40 positions shown · non-contrast
Comparison: Previous exam(s).

CLINICAL DATA: Screening.

EXAM:
DIGITAL SCREENING BILATERAL MAMMOGRAM WITH TOMO AND CAD

[L CC synth-2D]
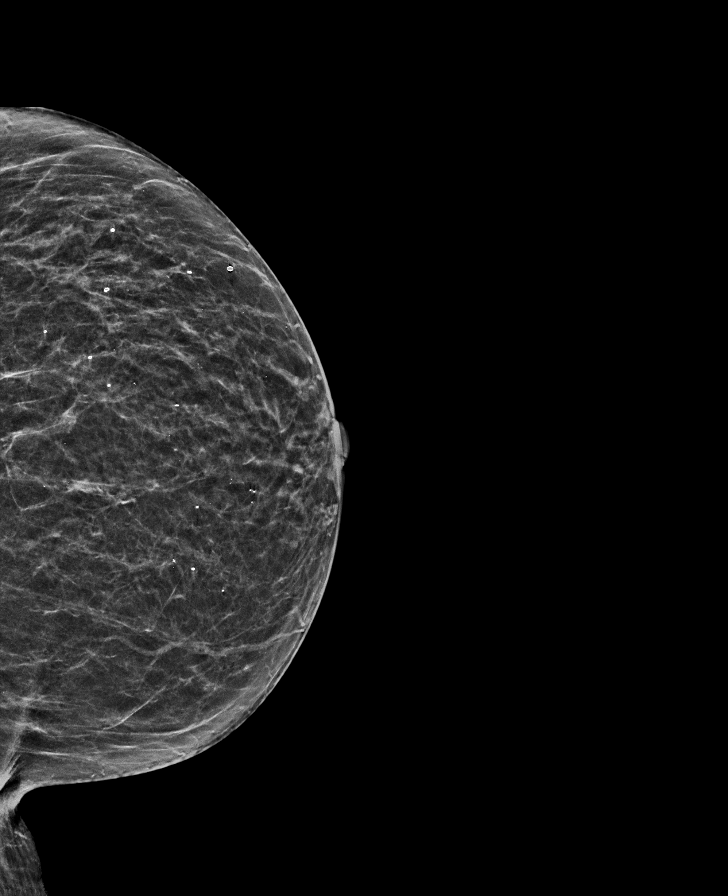

[L MLO synth-2D (1 of 2)]
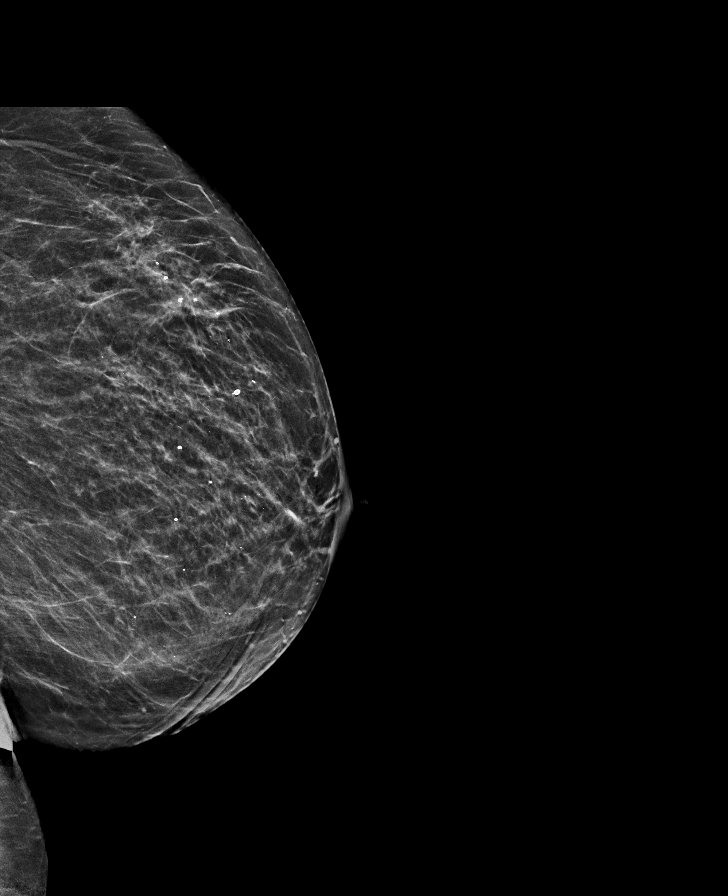

[R CC synth-2D (1 of 2)]
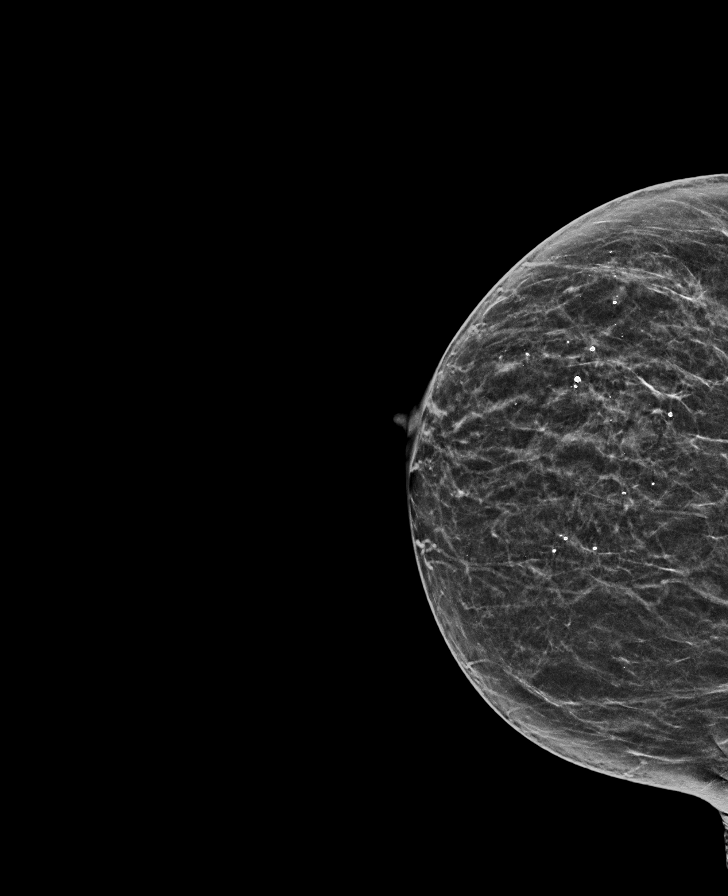

[R MLO synth-2D (1 of 2)]
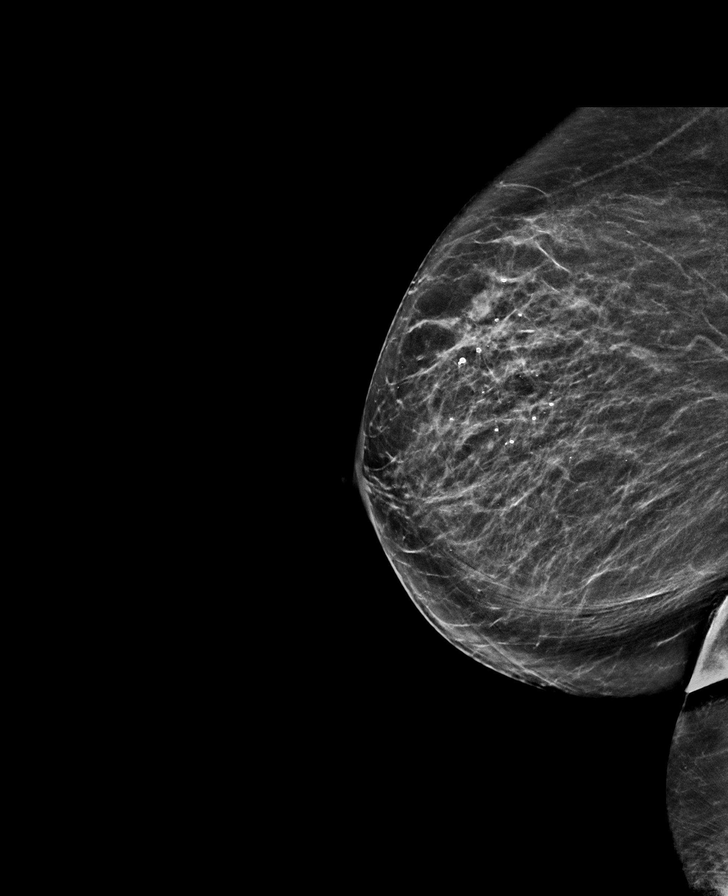

[L MLO synth-2D (2 of 2)]
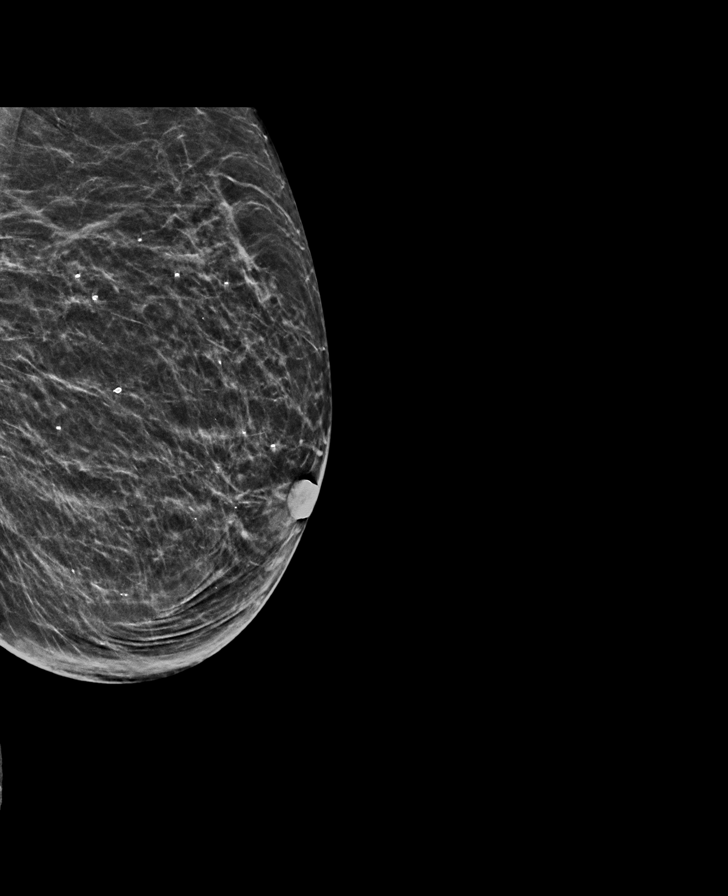

[R CC synth-2D (2 of 2)]
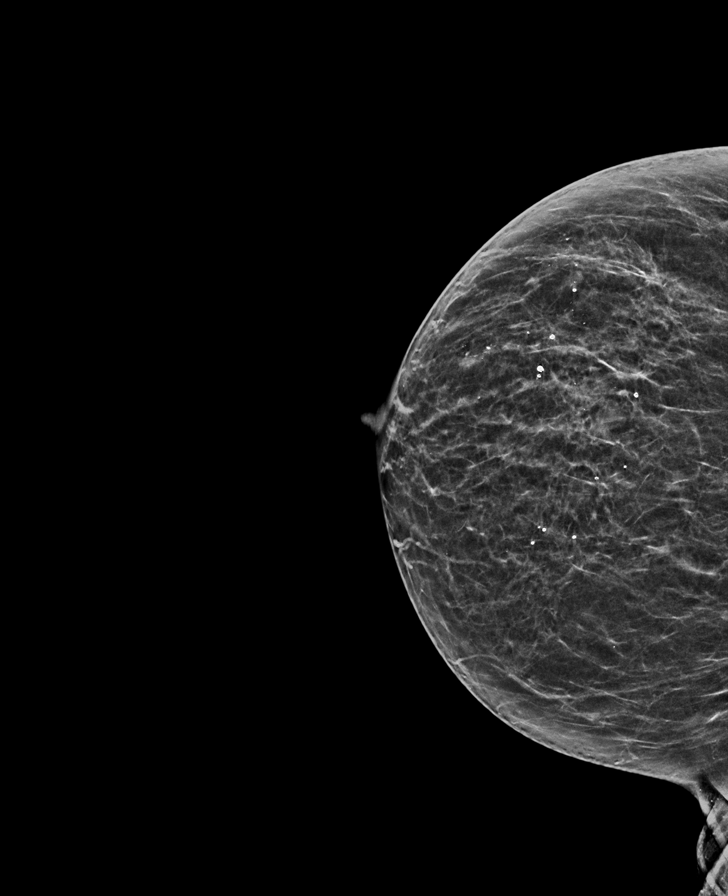

[R MLO synth-2D (2 of 2)]
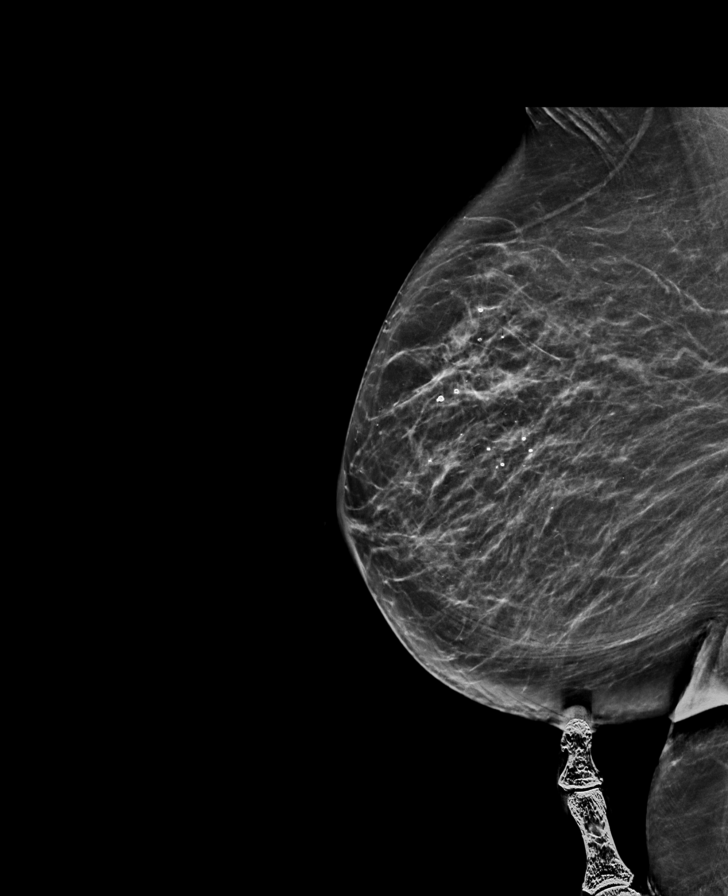

[L MLO tomo · tomo slice 29/58.0]
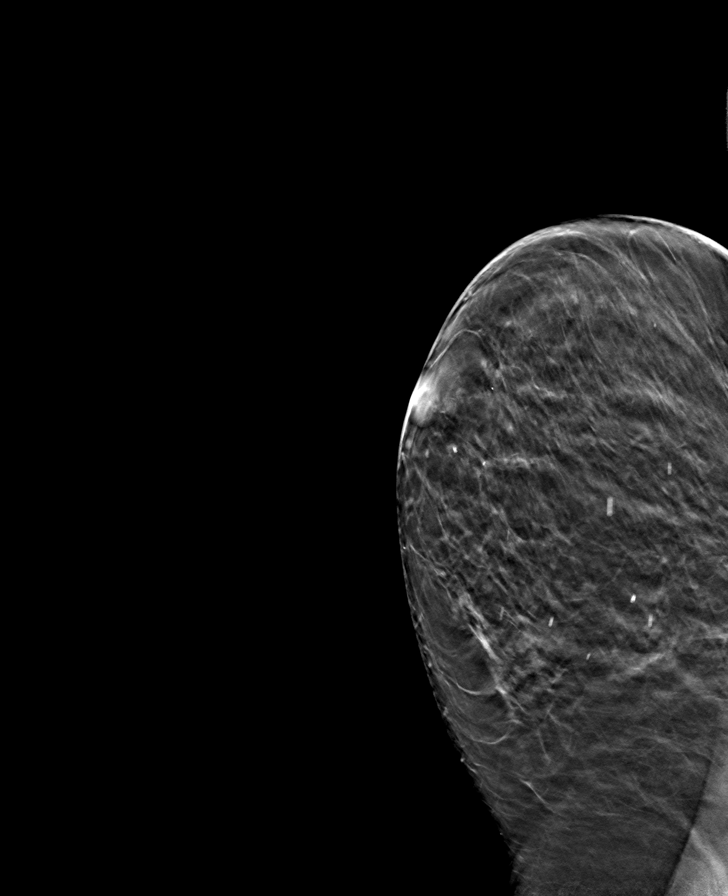

[8 of 40 positions shown; findings below may reference images not displayed]

ACR Breast Density Category b: There are scattered areas of
fibroglandular density.
FINDINGS: There are no findings suspicious for malignancy. Images were
processed with CAD.
IMPRESSION: No mammographic evidence of malignancy. A result letter of this
screening mammogram will be mailed directly to the patient.

RECOMMENDATION:
Screening mammogram in one year. (Code:[id number])

BI-RADS CATEGORY  1: Negative.

## 2024-04-28 ENCOUNTER — Other Ambulatory Visit: Payer: Self-pay

## 2024-04-28 ENCOUNTER — Emergency Department

## 2024-04-28 ENCOUNTER — Inpatient Hospital Stay
Admission: EM | Admit: 2024-04-28 | Discharge: 2024-05-17 | DRG: 641 | Disposition: E | Source: Skilled Nursing Facility | Attending: Student | Admitting: Student

## 2024-04-28 DIAGNOSIS — N179 Acute kidney failure, unspecified: Secondary | ICD-10-CM

## 2024-04-28 DIAGNOSIS — I5A Non-ischemic myocardial injury (non-traumatic): Secondary | ICD-10-CM | POA: Diagnosis present

## 2024-04-28 DIAGNOSIS — J811 Chronic pulmonary edema: Secondary | ICD-10-CM | POA: Diagnosis present

## 2024-04-28 DIAGNOSIS — N1831 Chronic kidney disease, stage 3a: Secondary | ICD-10-CM | POA: Diagnosis present

## 2024-04-28 DIAGNOSIS — E785 Hyperlipidemia, unspecified: Secondary | ICD-10-CM | POA: Diagnosis present

## 2024-04-28 DIAGNOSIS — Z515 Encounter for palliative care: Secondary | ICD-10-CM

## 2024-04-28 DIAGNOSIS — E875 Hyperkalemia: Secondary | ICD-10-CM | POA: Diagnosis present

## 2024-04-28 DIAGNOSIS — Z66 Do not resuscitate: Secondary | ICD-10-CM | POA: Diagnosis present

## 2024-04-28 DIAGNOSIS — D649 Anemia, unspecified: Secondary | ICD-10-CM | POA: Diagnosis present

## 2024-04-28 DIAGNOSIS — I214 Non-ST elevation (NSTEMI) myocardial infarction: Secondary | ICD-10-CM

## 2024-04-28 DIAGNOSIS — Z880 Allergy status to penicillin: Secondary | ICD-10-CM

## 2024-04-28 DIAGNOSIS — Z79899 Other long term (current) drug therapy: Secondary | ICD-10-CM

## 2024-04-28 DIAGNOSIS — E870 Hyperosmolality and hypernatremia: Secondary | ICD-10-CM | POA: Diagnosis not present

## 2024-04-28 DIAGNOSIS — S7002XA Contusion of left hip, initial encounter: Secondary | ICD-10-CM | POA: Diagnosis present

## 2024-04-28 DIAGNOSIS — R7989 Other specified abnormal findings of blood chemistry: Secondary | ICD-10-CM

## 2024-04-28 DIAGNOSIS — R627 Adult failure to thrive: Secondary | ICD-10-CM | POA: Diagnosis present

## 2024-04-28 DIAGNOSIS — N390 Urinary tract infection, site not specified: Principal | ICD-10-CM | POA: Diagnosis present

## 2024-04-28 DIAGNOSIS — N189 Chronic kidney disease, unspecified: Secondary | ICD-10-CM

## 2024-04-28 DIAGNOSIS — X58XXXA Exposure to other specified factors, initial encounter: Secondary | ICD-10-CM | POA: Diagnosis present

## 2024-04-28 DIAGNOSIS — E860 Dehydration: Secondary | ICD-10-CM | POA: Diagnosis present

## 2024-04-28 DIAGNOSIS — I129 Hypertensive chronic kidney disease with stage 1 through stage 4 chronic kidney disease, or unspecified chronic kidney disease: Secondary | ICD-10-CM | POA: Diagnosis present

## 2024-04-28 DIAGNOSIS — I10 Essential (primary) hypertension: Secondary | ICD-10-CM | POA: Insufficient documentation

## 2024-04-28 DIAGNOSIS — F0280 Dementia in other diseases classified elsewhere without behavioral disturbance: Secondary | ICD-10-CM | POA: Diagnosis present

## 2024-04-28 DIAGNOSIS — Z7982 Long term (current) use of aspirin: Secondary | ICD-10-CM

## 2024-04-28 DIAGNOSIS — Z87892 Personal history of anaphylaxis: Secondary | ICD-10-CM

## 2024-04-28 DIAGNOSIS — E878 Other disorders of electrolyte and fluid balance, not elsewhere classified: Secondary | ICD-10-CM | POA: Diagnosis present

## 2024-04-28 DIAGNOSIS — G309 Alzheimer's disease, unspecified: Secondary | ICD-10-CM | POA: Diagnosis present

## 2024-04-28 DIAGNOSIS — Z1152 Encounter for screening for COVID-19: Secondary | ICD-10-CM

## 2024-04-28 DIAGNOSIS — E872 Acidosis, unspecified: Secondary | ICD-10-CM | POA: Diagnosis present

## 2024-04-28 LAB — LACTIC ACID, PLASMA: Lactic Acid, Venous: 1.7 mmol/L (ref 0.5–1.9)

## 2024-04-28 LAB — BASIC METABOLIC PANEL WITH GFR
BUN: 107 mg/dL — ABNORMAL HIGH (ref 8–23)
CO2: 19 mmol/L — ABNORMAL LOW (ref 22–32)
Calcium: 9.3 mg/dL (ref 8.9–10.3)
Chloride: >130 mmol/L (ref 98–111)
Creatinine, Ser: 3.39 mg/dL — ABNORMAL HIGH (ref 0.44–1.00)
GFR, Estimated: 14 mL/min — ABNORMAL LOW (ref >60)
Glucose, Bld: 108 mg/dL — ABNORMAL HIGH (ref 70–99)
Potassium: 5.7 mmol/L — ABNORMAL HIGH (ref 3.5–5.1)
Sodium: >170 mmol/L (ref 135–145)

## 2024-04-28 LAB — CBC
HCT: 44.5 % (ref 36.0–46.0)
Hemoglobin: 13 g/dL (ref 12.0–15.0)
MCH: 25.3 pg — ABNORMAL LOW (ref 26.0–34.0)
MCHC: 29.2 g/dL — ABNORMAL LOW (ref 30.0–36.0)
MCV: 86.7 fL (ref 80.0–100.0)
Platelets: 344 K/uL (ref 150–400)
RBC: 5.13 MIL/uL — ABNORMAL HIGH (ref 3.87–5.11)
RDW: 18.5 % — ABNORMAL HIGH (ref 11.5–15.5)
WBC: 12.4 K/uL — ABNORMAL HIGH (ref 4.0–10.5)
nRBC: 0.6 % — ABNORMAL HIGH (ref 0.0–0.2)

## 2024-04-28 LAB — URINALYSIS, W/ REFLEX TO CULTURE (INFECTION SUSPECTED)
Bilirubin Urine: NEGATIVE
Glucose, UA: NEGATIVE mg/dL
Ketones, ur: NEGATIVE mg/dL
Nitrite: POSITIVE — AB
Protein, ur: 30 mg/dL — AB
Specific Gravity, Urine: 1.015 (ref 1.005–1.030)
WBC, UA: >50 WBC/hpf (ref 0–5)
pH: 5 (ref 5.0–8.0)

## 2024-04-28 LAB — RESP PANEL BY RT-PCR (RSV, FLU A&B, COVID)  RVPGX2
Influenza A by PCR: NEGATIVE
Influenza B by PCR: NEGATIVE
Resp Syncytial Virus by PCR: NEGATIVE
SARS Coronavirus 2 by RT PCR: NEGATIVE

## 2024-04-28 LAB — COMPREHENSIVE METABOLIC PANEL WITH GFR
ALT: 15 U/L (ref 0–44)
AST: 41 U/L (ref 15–41)
Albumin: 3.9 g/dL (ref 3.5–5.0)
Alkaline Phosphatase: 86 U/L (ref 38–126)
BUN: 106 mg/dL — ABNORMAL HIGH (ref 8–23)
CO2: 20 mmol/L — ABNORMAL LOW (ref 22–32)
Calcium: 9.6 mg/dL (ref 8.9–10.3)
Chloride: >130 mmol/L (ref 98–111)
Creatinine, Ser: 3.36 mg/dL — ABNORMAL HIGH (ref 0.44–1.00)
GFR, Estimated: 14 mL/min — ABNORMAL LOW (ref >60)
Glucose, Bld: 108 mg/dL — ABNORMAL HIGH (ref 70–99)
Potassium: 5.7 mmol/L — ABNORMAL HIGH (ref 3.5–5.1)
Sodium: >170 mmol/L (ref 135–145)
Total Bilirubin: 0.3 mg/dL (ref 0.0–1.2)
Total Protein: 7.8 g/dL (ref 6.5–8.1)

## 2024-04-28 LAB — PROTIME-INR
INR: 1.2 (ref 0.8–1.2)
Prothrombin Time: 16 s — ABNORMAL HIGH (ref 11.4–15.2)

## 2024-04-28 LAB — TROPONIN T, HIGH SENSITIVITY
Troponin T High Sensitivity: 201 ng/L (ref 0–19)
Troponin T High Sensitivity: 187 ng/L (ref 0–19)

## 2024-04-28 LAB — OSMOLALITY: Osmolality: 411 mosm/kg (ref 275–295)

## 2024-04-28 LAB — APTT: aPTT: 34 s (ref 24–36)

## 2024-04-28 MED ORDER — LOSARTAN POTASSIUM-HCTZ 100-25 MG PO TABS: 1.0000 | ORAL_TABLET | Freq: Every day | ORAL | Status: DC

## 2024-04-28 MED ORDER — ASPIRIN 81 MG PO TBEC
81.0000 mg | DELAYED_RELEASE_TABLET | Freq: Every day | ORAL | Status: DC
  Administered 2024-04-29: 81 mg via ORAL
  Filled 2024-04-28: qty 1

## 2024-04-28 MED ORDER — DONEPEZIL HCL 5 MG PO TABS: 10.0000 mg | ORAL_TABLET | Freq: Every day | ORAL | Status: DC

## 2024-04-28 MED ORDER — LACTATED RINGERS IV BOLUS (SEPSIS)
1000.0000 mL | Freq: Once | INTRAVENOUS | Status: AC
  Administered 2024-04-28: 1000 mL via INTRAVENOUS

## 2024-04-28 MED ORDER — ROSUVASTATIN CALCIUM 20 MG PO TABS
20.0000 mg | ORAL_TABLET | Freq: Every day | ORAL | Status: DC
  Administered 2024-04-29: 20 mg via ORAL
  Filled 2024-04-28: qty 1

## 2024-04-28 MED ORDER — ALPRAZOLAM 0.25 MG PO TABS
0.2500 mg | ORAL_TABLET | Freq: Two times a day (BID) | ORAL | Status: DC | PRN
Start: 2024-04-28 — End: 2024-05-01
  Administered 2024-04-29: 0.25 mg via ORAL
  Filled 2024-04-28 (×2): qty 1

## 2024-04-28 MED ORDER — SODIUM CHLORIDE 0.9 % IV BOLUS
1000.0000 mL | Freq: Once | INTRAVENOUS | Status: AC
  Administered 2024-04-28: 1000 mL via INTRAVENOUS

## 2024-04-28 MED ORDER — ASPIRIN 81 MG PO TBEC: 81.0000 mg | DELAYED_RELEASE_TABLET | Freq: Every day | ORAL | Status: DC

## 2024-04-28 MED ORDER — HEPARIN BOLUS VIA INFUSION
4000.0000 [IU] | Freq: Once | INTRAVENOUS | Status: AC
  Administered 2024-04-29: 4000 [IU] via INTRAVENOUS
  Filled 2024-04-28: qty 4000

## 2024-04-28 MED ORDER — NITROGLYCERIN 0.4 MG SL SUBL
0.4000 mg | SUBLINGUAL_TABLET | SUBLINGUAL | Status: DC | PRN
Start: 2024-04-28 — End: 2024-05-01

## 2024-04-28 MED ORDER — ASPIRIN 81 MG PO CHEW: 324.0000 mg | CHEWABLE_TABLET | ORAL | Status: AC

## 2024-04-28 MED ORDER — ACETAMINOPHEN 325 MG PO TABS: 650.0000 mg | ORAL_TABLET | ORAL | Status: DC | PRN

## 2024-04-28 MED ORDER — MAGNESIUM HYDROXIDE 400 MG/5ML PO SUSP: 30.0000 mL | Freq: Every day | ORAL | Status: DC | PRN

## 2024-04-28 MED ORDER — AMLODIPINE BESYLATE 5 MG PO TABS: 5.0000 mg | ORAL_TABLET | Freq: Every day | ORAL | Status: DC

## 2024-04-28 MED ORDER — DEXTROSE-SODIUM CHLORIDE 5-0.45 % IV SOLN: INTRAVENOUS | Status: DC

## 2024-04-28 MED ORDER — ASPIRIN 300 MG RE SUPP
300.0000 mg | RECTAL | Status: AC
  Administered 2024-04-29: 300 mg via RECTAL
  Filled 2024-04-28: qty 1

## 2024-04-28 MED ORDER — ONDANSETRON HCL 4 MG/2ML IJ SOLN
4.0000 mg | Freq: Four times a day (QID) | INTRAMUSCULAR | Status: DC | PRN
Start: 2024-04-28 — End: 2024-05-01

## 2024-04-28 MED ORDER — HEPARIN (PORCINE) 25000 UT/250ML-% IV SOLN
850.0000 [IU]/h | INTRAVENOUS | Status: DC
  Administered 2024-04-29: 850 [IU]/h via INTRAVENOUS
  Filled 2024-04-28: qty 250

## 2024-04-28 MED ORDER — LEVOFLOXACIN IN D5W 750 MG/150ML IV SOLN
750.0000 mg | Freq: Once | INTRAVENOUS | Status: AC
  Administered 2024-04-28: 750 mg via INTRAVENOUS
  Filled 2024-04-28: qty 150

## 2024-04-28 MED ORDER — TRAZODONE HCL 50 MG PO TABS
25.0000 mg | ORAL_TABLET | Freq: Every evening | ORAL | Status: DC | PRN
  Administered 2024-04-29: 25 mg via ORAL
  Filled 2024-04-28: qty 1

## 2024-04-28 NOTE — ED Provider Notes (Signed)
 [facility] Provider Note    Event Date/Time   First MD Initiated Contact with Patient 04/28/24 1935     (approximate)   History   Failure To Thrive   HPI  Holly Rocha is a 73 y.o. female with a history of hypertension and dementia who presents with generalized weakness and altered mental status.  Per the family members, the patient was noted to be less responsive than normal over the course of the day today.  She has not been eating or drinking.  She was sent from her facility for further evaluation.  The patient is normally nonverbal and is not able to provide any history.  However, the family states that normally she is alert and interactive, but today has been weak appearing and not responding to them.  I reviewed the past medical records.  The patient's most recent outpatient encounter in our system was in 2018 with neurology for follow-up of Alzheimer's.  She has no recent ED visits or hospitalizations.   Physical Exam   Triage Vital Signs: ED Triage Vitals  Encounter Vitals Group     BP 04/28/24 1904 (!) 96/53     Girls Systolic BP Percentile --      Girls Diastolic BP Percentile --      Boys Systolic BP Percentile --      Boys Diastolic BP Percentile --      Pulse --      Resp 04/28/24 1909 20     Temp 04/28/24 1909 97.9 F (36.6 C)     Temp src --      SpO2 04/28/24 1901 98 %     Weight 04/28/24 1903 145 lb (65.8 kg)     Height 04/28/24 1903 5' 7 (1.702 m)     Head Circumference --      Peak Flow --      Pain Score --      Pain Loc --      Pain Education --      Exclude from Growth Chart --     Most recent vital signs: Vitals:   04/28/24 2315 04/28/24 2330  BP: 116/70 (!) 131/51  Pulse: 90 95  Resp: (!) 24 (!) 22  Temp:    SpO2: 100% 100%     General: Awake, no distress.  CV:  Good peripheral perfusion.  Resp:  Normal effort.  Lungs CTAB. Abd:  Soft and nontender.  No distention.  Other:  EOMI.  PERRLA.  No facial  droop.  Dry mucous membranes.  Response to painful stimuli.   ED Results / Procedures / Treatments   Labs (all labs ordered are listed, but only abnormal results are displayed) Labs Reviewed  COMPREHENSIVE METABOLIC PANEL WITH GFR - Abnormal; Notable for the following components:      Result Value   Sodium >170 (*)    Potassium 5.7 (*)    Chloride >130 (*)    CO2 20 (*)    Glucose, Bld 108 (*)    BUN 106 (*)    Creatinine, Ser 3.36 (*)    GFR, Estimated 14 (*)    All other components within normal limits  CBC - Abnormal; Notable for the following components:   WBC 12.4 (*)    RBC 5.13 (*)    MCH 25.3 (*)    MCHC 29.2 (*)    RDW 18.5 (*)    nRBC 0.6 (*)    All other components within normal limits  URINALYSIS,  W/ REFLEX TO CULTURE (INFECTION SUSPECTED) - Abnormal; Notable for the following components:   Color, Urine YELLOW (*)    APPearance TURBID (*)    Hgb urine dipstick SMALL (*)    Protein, ur 30 (*)    Nitrite POSITIVE (*)    Leukocytes,Ua MODERATE (*)    Bacteria, UA MANY (*)    All other components within normal limits  BASIC METABOLIC PANEL WITH GFR - Abnormal; Notable for the following components:   Sodium >170 (*)    Potassium 5.7 (*)    Chloride >130 (*)    CO2 19 (*)    Glucose, Bld 108 (*)    BUN 107 (*)    Creatinine, Ser 3.39 (*)    GFR, Estimated 14 (*)    All other components within normal limits  OSMOLALITY - Abnormal; Notable for the following components:   Osmolality 411 (*)    All other components within normal limits  TROPONIN T, HIGH SENSITIVITY - Abnormal; Notable for the following components:   Troponin T High Sensitivity 201 (*)    All other components within normal limits  RESP PANEL BY RT-PCR (RSV, FLU A&B, COVID)  RVPGX2  CULTURE, BLOOD (ROUTINE X 2)  CULTURE, BLOOD (ROUTINE X 2)  LACTIC ACID, PLASMA  APTT  PROTIME-INR  LIPOPROTEIN A (LPA)  BASIC METABOLIC PANEL WITH GFR  LIPID PANEL  CBC  CBG MONITORING, ED  TROPONIN T, HIGH  SENSITIVITY     EKG  ED ECG REPORT I, Waylon Cassis, the attending physician, personally viewed and interpreted this ECG.  Date: 04/28/2024 EKG Time: 1947 Rate: 88 Rhythm: normal sinus rhythm QRS Axis: normal Intervals: normal ST/T Wave abnormalities: None specific ST abnormalities Narrative Interpretation: no evidence of acute ischemia    RADIOLOGY  Chest x-ray: I independently viewed and interpreted the images; there is no focal consolidation or edema  CT head: Progression of atrophy with no acute intracranial abnormality  PROCEDURES:  Critical Care performed: Yes, see critical care procedure note(s)  .Critical Care  Performed by: Cassis Waylon, MD Authorized by: Cassis Waylon, MD   Critical care provider statement:    Critical care time (minutes):  30   Critical care time was exclusive of:  Separately billable procedures and treating other patients   Critical care was necessary to treat or prevent imminent or life-threatening deterioration of the following conditions:  Metabolic crisis   Critical care was time spent personally by me on the following activities:  Development of treatment plan with patient or surrogate, discussions with consultants, evaluation of patient's response to treatment, examination of patient, ordering and review of laboratory studies, ordering and review of radiographic studies, ordering and performing treatments and interventions, pulse oximetry, re-evaluation of patient's condition, review of old charts and obtaining history from patient or surrogate   Care discussed with: admitting provider      MEDICATIONS ORDERED IN ED: Medications  levofloxacin (LEVAQUIN) IVPB 750 mg (750 mg Intravenous New Bag/Given 04/28/24 2250)  heparin bolus via infusion 4,000 Units (has no administration in time range)  heparin ADULT infusion 100 units/mL (25000 units/250mL) (has no administration in time range)  aspirin EC tablet 81 mg (has no  administration in time range)  amLODipine (NORVASC) tablet 5 mg (has no administration in time range)  losartan-hydrochlorothiazide (HYZAAR) 100-25 MG per tablet 1 tablet (has no administration in time range)  donepezil (ARICEPT) tablet 10 mg (has no administration in time range)  rosuvastatin (CRESTOR) tablet 20 mg (has no administration in time  range)  aspirin chewable tablet 324 mg (has no administration in time range)    Or  aspirin suppository 300 mg (has no administration in time range)  nitroGLYCERIN (NITROSTAT) SL tablet 0.4 mg (has no administration in time range)  acetaminophen (TYLENOL) tablet 650 mg (has no administration in time range)  ondansetron (ZOFRAN) injection 4 mg (has no administration in time range)  dextrose 5 % and 0.45 % NaCl infusion (has no administration in time range)  ALPRAZolam (XANAX) tablet 0.25 mg (has no administration in time range)  traZODone (DESYREL) tablet 25 mg (has no administration in time range)  magnesium hydroxide (MILK OF MAGNESIA) suspension 30 mL (has no administration in time range)  lactated ringers bolus 1,000 mL (0 mLs Intravenous Stopped 04/28/24 2130)  sodium chloride  0.9 % bolus 1,000 mL (1,000 mLs Intravenous New Bag/Given 04/28/24 2236)     IMPRESSION / MDM / ASSESSMENT AND PLAN / ED COURSE  I reviewed the triage vital signs and the nursing notes.  73 year old female with PMH as noted above presents with generalized weakness and altered mental status over the course of the day today.  On exam the patient initially was somewhat hypotensive although this has improved.  Other vital signs are normal.  Differential diagnosis includes, but is not limited to, dehydration, electrolyte abnormality, UTI, other infection, cardiac ischemia, acute CVA or other CNS etiology.  We will obtain CT head, chest x-ray, lab workup, give a fluid bolus, and reassess.  Patient's presentation is most consistent with acute presentation with potential threat  to life or bodily function.  The patient is on the cardiac monitor to evaluate for evidence of arrhythmia and/or significant heart rate changes.   ----------------------------------------- 10:33 PM on 04/28/2024 -----------------------------------------  CMP showed significant electrolyte abnormalities including sodium and chloride off scale high.  This was concerning for possible lab error or other spurious results so we repeated it but the repeat is the same along with an elevated creatinine and borderline potassium.  EKG shows no concerning changes.  Lactate is normal.  CBC shows a leukocytosis.  Urinalysis is consistent with a UTI.  The patient has reported history of anaphylaxis to penicillin so I have ordered a levofloxacin.  Troponin is also elevated consistent with demand ischemia.  I consulted and discussed the case with APP Maranda from the ICU.  The blood pressure had been somewhat low but is improving.  She advised aggressive IV hydration and if the blood pressure remains stable the patient will be appropriate for hospitalist admission.  ----------------------------------------- 11:34 PM on 04/28/2024 -----------------------------------------  Blood pressure remains stable.  I consulted Dr. Lawence from the hospitalist service; based on our discussion he agrees to evaluate the patient for admission.  He advises treating for potential NSTEMI, so I ordered heparin.   FINAL CLINICAL IMPRESSION(S) / ED DIAGNOSES   Final diagnoses:  Urinary tract infection without hematuria, site unspecified  Hypernatremia  Hyperchloremia  Elevated troponin     Rx / DC Orders   ED Discharge Orders     None        Note:  This document was prepared using Dragon voice recognition software and may include unintentional dictation errors.    Jacolyn Pae, MD 04/28/24 [phone]

## 2024-04-28 NOTE — Progress Notes (Signed)
 ANTICOAGULATION CONSULT NOTE  Pharmacy Consult for heparin infusion Indication: ACS/STEMI  Allergies  Allergen Reactions   Penicillins Anaphylaxis    Has patient had a PCN reaction causing immediate rash, facial/tongue/throat swelling, SOB or lightheadedness with hypotension: Yes Has patient had a PCN reaction causing severe rash involving mucus membranes or skin necrosis: No Has patient had a PCN reaction that required hospitalization No Has patient had a PCN reaction occurring within the last 10 years: No If all of the above answers are NO, then may proceed with Cephalosporin use.     Patient Measurements: Height: 5' 7 (170.2 cm) Weight: 65.8 kg (145 lb) IBW/kg (Calculated) : 61.6 HEPARIN DW (KG): 65.8  Vital Signs: Temp: 97.9 F (36.6 C) (11/12 1909) BP: 135/66 (11/12 2300) Pulse Rate: 90 (11/12 2300)  Labs: Recent Labs    04/28/24 1958 04/28/24 2105  HGB 13.0  --   HCT 44.5  --   PLT 344  --   CREATININE 3.36* 3.39*    Estimated Creatinine Clearance: 13.9 mL/min (A) (by C-G formula based on SCr of 3.39 mg/dL (H)).   Medical History: Past Medical History:  Diagnosis Date   Dementia (HCC)    Hypertension     Assessment: Pt is a 73 yo female presenting to ED d/t loss of appetite, found with elevated Troponin level.  Goal of Therapy:  Heparin level 0.3-0.7 units/ml Monitor platelets by anticoagulation protocol: Yes   Plan:  Bolus 4000 units x 1 Start heparin infusion at 850 units/hr Will check HL in 8 hr after start of infusion CBC daily while on heparin  Odile Veloso S. Pamula Luther, PharmD, [facility] 04/28/2024 11:14 PM

## 2024-04-28 NOTE — H&P (Signed)
 [place]   PATIENT NAME: Holly Rocha    MR#:  [id number]  DATE OF BIRTH:  1950/12/01  DATE OF ADMISSION:  04/28/2024  PRIMARY CARE PHYSICIAN: Diedra Lame, MD   Patient is coming from: [facility]  REQUESTING/REFERRING PHYSICIAN: Jacolyn Guild, MD  CHIEF COMPLAINT:   Chief Complaint  Patient presents with  . Failure To Thrive    HISTORY OF PRESENT ILLNESS:  Holly Rocha is a 73 y.o. African-American female with medical history significant for essential hypertension and dementia, presented to the emergency room with acute onset of altered mental status with generalized weakness.  The patient has not been eating or drinking much.  She was more lethargic with decreased responsiveness at [facility] where she resides.  She has been having diminished urine output.  No reported fever or chills.  No reported nausea or vomiting or abdominal pain.  History was obtained from her daughters.  ED Course: When she came to the ER, BP was 125/55 with otherwise normal vital signs.  Labs revealed sodium 117 and chloride more than 130 with a CO2 19 and a BUN of 107 with creatinine of 3.39.  High sensitive troponin I was 201 and later 187.  Serum osmolality was 411 and lactic acid was 1.7. CBC showed leukocytosis 12.4.  UA was positive for UTI.  EKG as reviewed by me : EKG showed sinus rhythm with a rate of 88 with LVH and T wave inversion laterally. 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  The patient was given 1 L bolus of IV normal saline, IV heparin bolus and drip, 4V aspirin, 750 mg IV Levaquin and 1 L bolus of IV lactated Ringer.  She will be admitted to a telemetry bed for further evaluation and management. PAST  MEDICAL HISTORY:   Past Medical History:  Diagnosis Date  . Dementia (HCC)   . Hypertension     PAST SURGICAL HISTORY:  History reviewed. No pertinent surgical history.  SOCIAL HISTORY:   Social History   Tobacco Use  . Smoking status: Some Days  . Smokeless tobacco: Not on file  Substance Use Topics  . Alcohol use: No    FAMILY HISTORY:  History reviewed. No pertinent family history. Noncontributory. DRUG ALLERGIES:   Allergies  Allergen Reactions  . Penicillins Anaphylaxis    Has patient had a PCN reaction causing immediate rash, facial/tongue/throat swelling, SOB or lightheadedness with hypotension: Yes Has patient had a PCN reaction causing severe rash involving mucus membranes or skin necrosis: No Has patient had a PCN reaction that required hospitalization No Has patient had a PCN reaction occurring within the last 10 years: No If all of the above answers are NO, then may proceed with Cephalosporin use.     REVIEW OF SYSTEMS:   ROS As per history of present illness. All pertinent systems were reviewed above. Constitutional, HEENT, cardiovascular, respiratory, GI, GU, musculoskeletal, neuro, psychiatric, endocrine, integumentary and hematologic systems were reviewed and are otherwise negative/unremarkable except for positive findings mentioned above in the HPI.   MEDICATIONS AT HOME:   Prior to Admission medications   Medication Sig Start Date End Date Taking? Authorizing Provider  amLODipine (NORVASC) 5 MG tablet Take 1 tablet by mouth daily. 09/20/15   [provider]  aspirin EC 81  MG tablet Take 1 tablet by mouth daily.    [provider]  clindamycin  (CLEOCIN ) 300 MG capsule Take 1 capsule (300 mg total) by mouth 4 (four) times daily. 01/02/16   Goodman, Graydon, MD  donepezil (ARICEPT) 10 MG tablet Take 1 tablet by mouth daily. 09/20/15   [provider]  losartan-hydrochlorothiazide (HYZAAR) 100-25 MG tablet Take 1 tablet by  mouth daily. 09/20/15   [provider]  lovastatin (MEVACOR) 40 MG tablet Take 1 tablet by mouth daily with supper. 09/20/15   [provider]      VITAL SIGNS:  Blood pressure (!) 111/55, pulse 78, temperature 97.6 F (36.4 C), resp. rate 17, height 5' 7 (1.702 m), weight 64.2 kg, SpO2 100%.  PHYSICAL EXAMINATION:  Physical Exam  GENERAL:  73 y.o.-year-old African American female patient lying in the bed with no acute distress.  EYES: Pupils equal, round, reactive to light and accommodation. No scleral icterus. Extraocular muscles intact.  HEENT: Head atraumatic, normocephalic. Oropharynx and nasopharynx clear.  NECK:  Supple, no jugular venous distention. No thyroid enlargement, no tenderness.  LUNGS: Normal breath sounds bilaterally, no wheezing, rales,rhonchi or crepitation. No use of accessory muscles of respiration.  CARDIOVASCULAR: Regular rate and rhythm, S1, S2 normal. No murmurs, rubs, or gallops.  ABDOMEN: Soft, nondistended, nontender. Bowel sounds present. No organomegaly or mass.  EXTREMITIES: No pedal edema, cyanosis, or clubbing.  NEUROLOGIC: Cranial nerves II through XII are intact. Muscle strength 5/5 in all extremities. Sensation intact. Gait not checked.  PSYCHIATRIC: The patient is alert and oriented x 3.  Normal affect and good eye contact. SKIN: No obvious rash, lesion, or ulcer.   LABORATORY PANEL:   CBC Recent Labs  Lab 04/29/24 0549  WBC 9.2  HGB 9.8*  HCT 33.2*  PLT 261   ------------------------------------------------------------------------------------------------------------------  Chemistries  Recent Labs  Lab 04/28/24 1958 04/28/24 2105  NA >170* >170*  K 5.7* 5.7*  CL >130* >130*  CO2 20* 19*  GLUCOSE 108* 108*  BUN 106* 107*  CREATININE 3.36* 3.39*  CALCIUM 9.6 9.3  AST 41  --   ALT 15  --   ALKPHOS 86  --   BILITOT 0.3  --     ------------------------------------------------------------------------------------------------------------------  Cardiac Enzymes No results for input(s): TROPONINI in the last 168 hours. ------------------------------------------------------------------------------------------------------------------  RADIOLOGY:  CT Head Wo Contrast Result Date: 04/28/2024 CLINICAL DATA:  Mental status change EXAM: CT HEAD WITHOUT CONTRAST TECHNIQUE: Contiguous axial images were obtained from the base of the skull through the vertex without intravenous contrast. RADIATION DOSE REDUCTION: This exam was performed according to the departmental dose-optimization program which includes automated exposure control, adjustment of the mA and/or kV according to patient size and/or use of iterative reconstruction technique. COMPARISON:  CT 09/27/2015, MRI Oct 28, 2014 FINDINGS: Brain: No acute territorial infarction, hemorrhage or intracranial mass. Small foci of encephalomalacia within the bilateral frontal lobes consistent with chronic infarcts. Significant interval atrophy progression, advanced. Mild chronic small vessel ischemic changes of the white matter. Ventricular enlargement likely related to atrophy Vascular: No hyperdense vessels.  Carotid vascular calcification Skull: Normal. Negative for fracture or focal lesion. Sinuses/Orbits: No acute finding. Other: None IMPRESSION: 1. No CT evidence for acute intracranial abnormality. 2. Significant interval atrophy progression since 2017. Mild chronic small vessel ischemic changes of the white matter. 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 Electronically Signed   By: Luke Bun M.D.   On: 04/28/2024 20:44   DG Chest [facility] 1 View Result  Date: 04/28/2024 CLINICAL DATA:  Sepsis. EXAM: PORTABLE CHEST 1 VIEW COMPARISON:  Chest radiograph dated 09/27/2015. FINDINGS: No focal consolidation, pleural effusion or pneumothorax. The cardiac silhouette is  within limits. No acute osseous pathology. IMPRESSION: No active disease. Electronically Signed   By: Vanetta Chou M.D.   On: 04/28/2024 20:25      IMPRESSION AND PLAN:  Assessment and Plan: * Hypernatremia - The patient will be admitted to a medical telemetry bed. - Will continue hydration with D5 half-normal saline. - Will follow sodium levels. - Will avoid diuretic therapy.  NSTEMI (non-ST elevated myocardial infarction) (HCC) - Differential would otherwise include demand ischemia especially given tapering levels of troponin I. - Will place the patient on high-dose statin for now and continue IV heparin. - Cardiology consult and 2D echo be obtained. - I notified Dr. Ammon about the patient.  Acute kidney injury superimposed on chronic kidney disease - This is AKI superimposed on stage IIIa CKD. - This is likely secondary to severe dehydration. - The patient will be hydrated as mentioned above and will follow BMP. - Would avoid nephrotoxins.  Essential hypertension - Will continue amlodipine, Coreg and hold off Hyzaar.  Dyslipidemia - Will continue high-dose statin therapy.   DVT prophylaxis: Lovenox. Advanced Care Planning:  Code Status: The patient is DNR and DNI. Family Communication:  The plan of care was discussed in details with the patient (and family). I answered all questions. The patient agreed to proceed with the above mentioned plan. Further management will depend upon hospital course. Disposition Plan: Back to previous home environment Consults called: Cardiology All the records are reviewed and case discussed with ED provider.  Status is: Inpatient   At the time of the admission, it appears that the appropriate admission status for this patient is inpatient.  This is judged to be reasonable and necessary in order to provide the required intensity of service to ensure the patient's safety given the presenting symptoms, physical exam findings and  initial radiographic and laboratory data in the context of comorbid conditions.  The patient requires inpatient status due to high intensity of service, high risk of further deterioration and high frequency of surveillance required.  I certify that at the time of admission, it is my clinical judgment that the patient will require inpatient hospital care extending more than 2 midnights.                            Dispo: The patient is from: Home              Anticipated d/c is to: Home              Patient currently is not medically stable to d/c.              Difficult to place patient: No  Madison DELENA Peaches M.D on 04/29/2024 at 7:32 AM  [facility]   From 7 PM-7 AM, contact night-coverage [email or web address]  CC: Primary care physician; Diedra Lame, MD

## 2024-04-28 NOTE — ED Triage Notes (Signed)
 Pt to ED via EMS from [facility], pt sent here to be evaluated for dec PO intake today. Pt ate dinner last night but has not eaten today. Pt has hx dementia, pt is at baseline at this time.

## 2024-04-29 ENCOUNTER — Inpatient Hospital Stay
Admit: 2024-04-29 | Discharge: 2024-04-29 | Disposition: A | Discharge status: Home / Self Care | Attending: Family Medicine | Admitting: Family Medicine

## 2024-04-29 DIAGNOSIS — E870 Hyperosmolality and hypernatremia: Secondary | ICD-10-CM

## 2024-04-29 DIAGNOSIS — N179 Acute kidney failure, unspecified: Secondary | ICD-10-CM

## 2024-04-29 DIAGNOSIS — E785 Hyperlipidemia, unspecified: Secondary | ICD-10-CM

## 2024-04-29 DIAGNOSIS — I214 Non-ST elevation (NSTEMI) myocardial infarction: Secondary | ICD-10-CM

## 2024-04-29 DIAGNOSIS — I10 Essential (primary) hypertension: Secondary | ICD-10-CM

## 2024-04-29 LAB — BASIC METABOLIC PANEL WITH GFR
BUN: 90 mg/dL — ABNORMAL HIGH (ref 8–23)
BUN: 85 mg/dL — ABNORMAL HIGH (ref 8–23)
BUN: 84 mg/dL — ABNORMAL HIGH (ref 8–23)
BUN: 81 mg/dL — ABNORMAL HIGH (ref 8–23)
BUN: 80 mg/dL — ABNORMAL HIGH (ref 8–23)
CO2: 17 mmol/L — ABNORMAL LOW (ref 22–32)
CO2: 19 mmol/L — ABNORMAL LOW (ref 22–32)
CO2: 18 mmol/L — ABNORMAL LOW (ref 22–32)
CO2: 17 mmol/L — ABNORMAL LOW (ref 22–32)
CO2: 17 mmol/L — ABNORMAL LOW (ref 22–32)
Calcium: 8.4 mg/dL — ABNORMAL LOW (ref 8.9–10.3)
Calcium: 8.7 mg/dL — ABNORMAL LOW (ref 8.9–10.3)
Calcium: 8.7 mg/dL — ABNORMAL LOW (ref 8.9–10.3)
Calcium: 8.6 mg/dL — ABNORMAL LOW (ref 8.9–10.3)
Calcium: 8.3 mg/dL — ABNORMAL LOW (ref 8.9–10.3)
Chloride: >130 mmol/L (ref 98–111)
Chloride: >130 mmol/L (ref 98–111)
Chloride: >130 mmol/L (ref 98–111)
Chloride: >130 mmol/L (ref 98–111)
Chloride: >130 mmol/L (ref 98–111)
Creatinine, Ser: 2.68 mg/dL — ABNORMAL HIGH (ref 0.44–1.00)
Creatinine, Ser: 2.67 mg/dL — ABNORMAL HIGH (ref 0.44–1.00)
Creatinine, Ser: 2.67 mg/dL — ABNORMAL HIGH (ref 0.44–1.00)
Creatinine, Ser: 2.67 mg/dL — ABNORMAL HIGH (ref 0.44–1.00)
Creatinine, Ser: 2.79 mg/dL — ABNORMAL HIGH (ref 0.44–1.00)
GFR, Estimated: 18 mL/min — ABNORMAL LOW (ref >60)
GFR, Estimated: 18 mL/min — ABNORMAL LOW (ref >60)
GFR, Estimated: 18 mL/min — ABNORMAL LOW (ref >60)
GFR, Estimated: 18 mL/min — ABNORMAL LOW (ref >60)
GFR, Estimated: 17 mL/min — ABNORMAL LOW (ref >60)
Glucose, Bld: 124 mg/dL — ABNORMAL HIGH (ref 70–99)
Glucose, Bld: 107 mg/dL — ABNORMAL HIGH (ref 70–99)
Glucose, Bld: 140 mg/dL — ABNORMAL HIGH (ref 70–99)
Glucose, Bld: 158 mg/dL — ABNORMAL HIGH (ref 70–99)
Glucose, Bld: 139 mg/dL — ABNORMAL HIGH (ref 70–99)
Potassium: 5 mmol/L (ref 3.5–5.1)
Potassium: 5.1 mmol/L (ref 3.5–5.1)
Potassium: 4.7 mmol/L (ref 3.5–5.1)
Potassium: 4.7 mmol/L (ref 3.5–5.1)
Potassium: 4.9 mmol/L (ref 3.5–5.1)
Sodium: >170 mmol/L (ref 135–145)
Sodium: >170 mmol/L (ref 135–145)
Sodium: 168 mmol/L (ref 135–145)
Sodium: 164 mmol/L (ref 135–145)
Sodium: 162 mmol/L (ref 135–145)

## 2024-04-29 LAB — BLOOD CULTURE ID PANEL (REFLEXED) - BCID2

## 2024-04-29 LAB — CBC
HCT: 33.2 % — ABNORMAL LOW (ref 36.0–46.0)
HCT: 32.7 % — ABNORMAL LOW (ref 36.0–46.0)
Hemoglobin: 9.8 g/dL — ABNORMAL LOW (ref 12.0–15.0)
Hemoglobin: 9.6 g/dL — ABNORMAL LOW (ref 12.0–15.0)
MCH: 25.6 pg — ABNORMAL LOW (ref 26.0–34.0)
MCH: 25.6 pg — ABNORMAL LOW (ref 26.0–34.0)
MCHC: 29.5 g/dL — ABNORMAL LOW (ref 30.0–36.0)
MCHC: 29.4 g/dL — ABNORMAL LOW (ref 30.0–36.0)
MCV: 86.7 fL (ref 80.0–100.0)
MCV: 87.2 fL (ref 80.0–100.0)
Platelets: 261 K/uL (ref 150–400)
Platelets: 270 K/uL (ref 150–400)
RBC: 3.83 MIL/uL — ABNORMAL LOW (ref 3.87–5.11)
RBC: 3.75 MIL/uL — ABNORMAL LOW (ref 3.87–5.11)
RDW: 18 % — ABNORMAL HIGH (ref 11.5–15.5)
RDW: 17.8 % — ABNORMAL HIGH (ref 11.5–15.5)
WBC: 9.2 K/uL (ref 4.0–10.5)
WBC: 9.9 K/uL (ref 4.0–10.5)
nRBC: 0.4 % — ABNORMAL HIGH (ref 0.0–0.2)
nRBC: 0.4 % — ABNORMAL HIGH (ref 0.0–0.2)

## 2024-04-29 LAB — ECHOCARDIOGRAM COMPLETE
AR max vel: 1.33 cm2
AV Area VTI: 1.34 cm2
AV Area mean vel: 1.28 cm2
AV Mean grad: 10 mmHg
AV Peak grad: 18.4 mmHg
Ao pk vel: 2.15 m/s
Area-P 1/2: 4.49 cm2
Height: 67 in
P 1/2 time: 636 ms
S' Lateral: 1.9 cm
Weight: 2264.57 [oz_av]

## 2024-04-29 LAB — LIPID PANEL
Cholesterol: 191 mg/dL (ref 0–200)
HDL: 42 mg/dL (ref >40)
LDL Cholesterol: 130 mg/dL — ABNORMAL HIGH (ref 0–99)
Total CHOL/HDL Ratio: 4.6 ratio
Triglycerides: 97 mg/dL (ref <150)
VLDL: 19 mg/dL (ref 0–40)

## 2024-04-29 LAB — HEPARIN LEVEL (UNFRACTIONATED)
Heparin Unfractionated: >1.1 [IU]/mL — ABNORMAL HIGH (ref 0.30–0.70)
Heparin Unfractionated: >1.1 [IU]/mL — ABNORMAL HIGH (ref 0.30–0.70)

## 2024-04-29 MED ORDER — HYDROMORPHONE HCL 1 MG/ML IJ SOLN
1.0000 mg | Freq: Once | INTRAMUSCULAR | Status: AC
  Administered 2024-04-29: 1 mg via INTRAVENOUS
  Filled 2024-04-29: qty 1

## 2024-04-29 MED ORDER — HEPARIN (PORCINE) 25000 UT/250ML-% IV SOLN
350.0000 [IU]/h | INTRAVENOUS | Status: DC
  Administered 2024-04-29: 450 [IU]/h via INTRAVENOUS

## 2024-04-29 MED ORDER — HYDROMORPHONE HCL 1 MG/ML IJ SOLN
0.5000 mg | INTRAMUSCULAR | Status: DC | PRN
  Administered 2024-04-30 – 2024-05-01 (×3): 1 mg via INTRAVENOUS
  Administered 2024-05-01: 0.5 mg via INTRAVENOUS
  Filled 2024-04-29 (×4): qty 1

## 2024-04-29 MED ORDER — CHLORHEXIDINE GLUCONATE CLOTH 2 % EX PADS
6.0000 | MEDICATED_PAD | Freq: Every day | CUTANEOUS | Status: DC
  Administered 2024-04-29 – 2024-04-30 (×2): 6 via TOPICAL

## 2024-04-29 MED ORDER — ACETAMINOPHEN 10 MG/ML IV SOLN
1000.0000 mg | Freq: Four times a day (QID) | INTRAVENOUS | Status: DC
  Administered 2024-04-29: 1000 mg via INTRAVENOUS
  Filled 2024-04-29 (×3): qty 100

## 2024-04-29 MED ORDER — HEPARIN (PORCINE) 25000 UT/250ML-% IV SOLN
650.0000 [IU]/h | INTRAVENOUS | Status: DC
  Administered 2024-04-29: 650 [IU]/h via INTRAVENOUS
  Filled 2024-04-29: qty 250

## 2024-04-29 MED ORDER — PERFLUTREN LIPID MICROSPHERE
1.0000 mL | INTRAVENOUS | Status: AC | PRN
  Administered 2024-04-29: 3 mL via INTRAVENOUS

## 2024-04-29 MED ORDER — ACETAMINOPHEN 325 MG PO TABS: 650.0000 mg | ORAL_TABLET | ORAL | Status: DC

## 2024-04-29 MED ORDER — HYDROMORPHONE HCL 1 MG/ML IJ SOLN
0.5000 mg | INTRAMUSCULAR | Status: DC | PRN
  Administered 2024-04-29: 0.5 mg via INTRAVENOUS
  Filled 2024-04-29: qty 0.5

## 2024-04-29 MED ORDER — DEXTROSE 5 % IV SOLN: INTRAVENOUS | Status: AC

## 2024-04-29 NOTE — Progress Notes (Signed)
 Progress Note   Patient: Holly Rocha FMW:[id number] DOB: 1950-11-25 DOA: 04/28/2024     1 DOS: the patient was seen and examined on 04/29/2024   Brief hospital course: Holly Rocha is a 73 y.o. African-American female with medical history significant for essential hypertension and dementia, presented to the emergency room with acute onset of altered mental status with generalized weakness.  The patient has not been eating or drinking much.  She was more lethargic with decreased responsiveness at [facility] where she resides.  She has been having diminished urine output.  No reported fever or chills.  No reported nausea or vomiting or abdominal pain.  History was obtained from her daughters.   ED Course: When she came to the ER, BP was 125/55 with otherwise normal vital signs.  Labs revealed sodium 117 and chloride more than 130 with a CO2 19 and a BUN of 107 with creatinine of 3.39.  High sensitive troponin I was 201 and later 187.  Serum osmolality was 411 and lactic acid was 1.7. CBC showed leukocytosis 12.4.  UA was positive for UTI.   EKG as reviewed by me : EKG showed sinus rhythm with a rate of 88 with LVH and T wave inversion laterally. 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   The patient was given 1 L bolus of IV normal saline, IV heparin bolus and drip, 4V aspirin, 750 mg IV Levaquin and 1 L bolus of IV lactated Ringer.  She will be admitted to a telemetry bed for further evaluation and management.  Assessment and Plan: * Hypernatremia, severe - S/p 1/2 nl saline. Transitioned to D5 --Likely related to poor oral intake in the setting of advanced dementia - Will follow sodium levels - Will avoid diuretic  therapy  Non ischemic myocardial injury (HCC) --Unable to verbalize symptoms, troponin 201>187, EKG without evidence of acute ischemia  - TTE LVEF 60 to 65%, no RWMA, mod LVH, G1dd, RV sys fxn is nl, mild AR/AS - Patient was started on IV heparin - Cardiology consulted, recommended medical management  Normocytic anemia  Per patient's outpt provider hgb is ~9.8-11.  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  Serum creatinine elevated to 3.39 - This is likely secondary to severe dehydration. - The patient will be hydrated as mentioned above and will follow BMP. - Would avoid nephrotoxins.  Essential hypertension - Hold her antihypertensives for now.   Dyslipidemia - Will continue high-dose statin therapy.  Possible UTI UA with >50 WBC, and many bacteria also noted to have 11-20 squamous epithelial cells.  She is s/p 1x dose of levaquin.  -Will hold on further treatment as patients worsening mental status likely due to severe hypernatremia.   Hyperkalemia Resolved  Metabolic acidosis Improving with IV fluids. Because sodium levels so elevated AG not able to be calculated.  -Check VBG in the AM, continue with IV fluids   Advanced dementia Patient in memory care facility since 2021. Has had progressive decline since then. Is not verbal and not able to perform any iadls/adls independently.   -Hold home aricept      Subjective: Patient is nonverbal. Resting comfortably.   Physical  Exam: Vitals:   04/29/24 0532 04/29/24 0804 04/29/24 1153 04/29/24 1609  BP:  (!) 128/49 133/68 (!) 176/82  Pulse:  77 82 (!) 115  Resp:  16  20  Temp:  (!) 97.5 F (36.4 C) 98.5 F (36.9 C) 98.8 F (37.1 C)  TempSrc:   Oral   SpO2:  100% 100% 99%  Weight: 64.2 kg     Height:       Physical Exam  Constitutional: In no distress.  Cardiovascular: Normal  rate, regular rhythm. No lower extremity edema  Pulmonary: Non labored breathing on room air, no wheezing or rales.   Abdominal: Soft. Non distended and non tender Musculoskeletal: Large hematoma on Lateral aspect of L thigh     Neurological: Opens eyes spontaneously, no purposeful movements Skin: Skin is warm and dry.   Data Reviewed:     Latest Ref Rng & Units 04/29/2024    2:46 PM 04/29/2024    1:28 PM 04/29/2024    8:41 AM  BMP  Glucose 70 - 99 mg/dL 859  892  875   BUN 8 - 23 mg/dL 84  85  90   Creatinine 0.44 - 1.00 mg/dL 7.32  7.32  7.31   Sodium 135 - 145 mmol/L 168  >170  >170   Potassium 3.5 - 5.1 mmol/L 4.7  5.1  5.0   Chloride 98 - 111 mmol/L >130  >130  >130   CO2 22 - 32 mmol/L 18  19  17    Calcium 8.9 - 10.3 mg/dL 8.7  8.7  8.4       Latest Ref Rng & Units 04/29/2024    1:28 PM 04/29/2024    5:49 AM 04/28/2024    7:58 PM  CBC  WBC 4.0 - 10.5 K/uL 9.9  9.2  12.4   Hemoglobin 12.0 - 15.0 g/dL 9.6  9.8  86.9   Hematocrit 36.0 - 46.0 % 32.7  33.2  44.5   Platelets 150 - 400 K/uL 270  261  344      Family Communication: spouse, daughters   Disposition: Status is: Inpatient Remains inpatient appropriate because: severe electrolyte derangemetns   Planned Discharge Destination: memory care facility,     Time spent: 35 minutes  Author: Alban Pepper, MD 04/29/2024 4:20 PM  For on call review [email or web address].

## 2024-04-29 NOTE — Hospital Course (Addendum)
 6 months ago in June had hematoma. L thigh.   Lanette nurse practionioner. With pace.

## 2024-04-29 NOTE — Plan of Care (Signed)
 Serum sodium gradually dropping from >170 to >164. Chloride is still >130. MD made aware throughout my shift. Patient became very agitated throughout my shift and appeared to be in pain. Patient started constantly moaning with every exhale was repeatedly coughing, I notified MD and family in room and it was decided to make her NPO. Dilaudid given for pain.    Problem: Education: Goal: Understanding of cardiac disease, CV risk reduction, and recovery process will improve Outcome: Not Progressing   Problem: Activity: Goal: Ability to tolerate increased activity will improve Outcome: Not Progressing   Problem: Cardiac: Goal: Ability to achieve and maintain adequate cardiovascular perfusion will improve Outcome: Not Progressing   Problem: Health Behavior/Discharge Planning: Goal: Ability to safely manage health-related needs after discharge will improve Outcome: Not Progressing   Problem: Clinical Measurements: Goal: Ability to maintain clinical measurements within normal limits will improve Outcome: Progressing

## 2024-04-29 NOTE — Assessment & Plan Note (Signed)
-   Will continue high-dose statin therapy.

## 2024-04-29 NOTE — Progress Notes (Signed)
 PT Cancellation Note  Patient Details Name: Holly Rocha MRN: [id number] DOB: 09/20/1950   Cancelled Treatment:    Reason Eval/Treat Not Completed: OT screened, no needs identified, will sign off Pt hoyer life at baseline. Pt and family in room notified by OT.    Arva Slaugh A Bonni Neuser 04/29/2024, 2:58 PM

## 2024-04-29 NOTE — Assessment & Plan Note (Signed)
-   Differential would otherwise include demand ischemia especially given tapering levels of troponin I. - Will place the patient on high-dose statin for now and continue IV heparin. - Cardiology consult and 2D echo be obtained. - I notified Dr. Ammon about the patient.

## 2024-04-29 NOTE — Assessment & Plan Note (Signed)
-   This is AKI superimposed on stage IIIa CKD. - This is likely secondary to severe dehydration. - The patient will be hydrated as mentioned above and will follow BMP. - Would avoid nephrotoxins.

## 2024-04-29 NOTE — Plan of Care (Signed)

## 2024-04-29 NOTE — Progress Notes (Signed)
 PHARMACY - PHYSICIAN COMMUNICATION CRITICAL VALUE ALERT - BLOOD CULTURE IDENTIFICATION (BCID)  Holly Rocha is an 73 y.o. female who presented to [facility] on 04/28/2024 with a chief complaint of hypernatremia  Assessment:  Lab called with [facility] in 1/4 bottles from 11/12 blood cx, BCID showing staph species (NOT S. aureus). Seems most likely contaminant. Provider agrees.   Name of physician (or Provider) Contacted: Dr. Franchot  Current antibiotics: None  Changes to prescribed antibiotics recommended:  No changes. Will continue to monitor off antibiotics   Results for orders placed or performed during the hospital encounter of 04/28/24  Blood Culture ID Panel (Reflexed) (Collected: 04/28/2024  8:41 PM)  Result Value Ref Range   Enterococcus faecalis NOT DETECTED NOT DETECTED   Enterococcus Faecium NOT DETECTED NOT DETECTED   Listeria monocytogenes NOT DETECTED NOT DETECTED   Staphylococcus species DETECTED (A) NOT DETECTED   Staphylococcus aureus (BCID) NOT DETECTED NOT DETECTED   Staphylococcus epidermidis NOT DETECTED NOT DETECTED   Staphylococcus lugdunensis NOT DETECTED NOT DETECTED   Streptococcus species NOT DETECTED NOT DETECTED   Streptococcus agalactiae NOT DETECTED NOT DETECTED   Streptococcus pneumoniae NOT DETECTED NOT DETECTED   Streptococcus pyogenes NOT DETECTED NOT DETECTED   A.calcoaceticus-baumannii NOT DETECTED NOT DETECTED   Bacteroides fragilis NOT DETECTED NOT DETECTED   Enterobacterales NOT DETECTED NOT DETECTED   Enterobacter cloacae complex NOT DETECTED NOT DETECTED   Escherichia coli NOT DETECTED NOT DETECTED   Klebsiella aerogenes NOT DETECTED NOT DETECTED   Klebsiella oxytoca NOT DETECTED NOT DETECTED   Klebsiella pneumoniae NOT DETECTED NOT DETECTED   Proteus species NOT DETECTED NOT DETECTED   Salmonella species NOT DETECTED NOT DETECTED   Serratia marcescens NOT DETECTED NOT DETECTED   Haemophilus influenzae NOT DETECTED NOT DETECTED    Neisseria meningitidis NOT DETECTED NOT DETECTED   Pseudomonas aeruginosa NOT DETECTED NOT DETECTED   Stenotrophomonas maltophilia NOT DETECTED NOT DETECTED   Candida albicans NOT DETECTED NOT DETECTED   Candida auris NOT DETECTED NOT DETECTED   Candida glabrata NOT DETECTED NOT DETECTED   Candida krusei NOT DETECTED NOT DETECTED   Candida parapsilosis NOT DETECTED NOT DETECTED   Candida tropicalis NOT DETECTED NOT DETECTED   Cryptococcus neoformans/gattii NOT DETECTED NOT DETECTED    Ransom Blanch PGY-1 Pharmacy Resident  [facility]  04/29/2024 5:08 PM

## 2024-04-29 NOTE — Assessment & Plan Note (Signed)
-   The patient will be admitted to a medical telemetry bed. - Will continue hydration with D5 half-normal saline. - Will follow sodium levels. - Will avoid diuretic therapy.

## 2024-04-29 NOTE — Progress Notes (Signed)
 ANTICOAGULATION CONSULT NOTE  Pharmacy Consult for heparin infusion Indication: ACS/STEMI  Allergies  Allergen Reactions   Penicillins Anaphylaxis    Has patient had a PCN reaction causing immediate rash, facial/tongue/throat swelling, SOB or lightheadedness with hypotension: Yes Has patient had a PCN reaction causing severe rash involving mucus membranes or skin necrosis: No Has patient had a PCN reaction that required hospitalization No Has patient had a PCN reaction occurring within the last 10 years: No If all of the above answers are NO, then may proceed with Cephalosporin use.     Patient Measurements: Height: 5' 7 (170.2 cm) Weight: 64.2 kg (141 lb 8.6 oz) IBW/kg (Calculated) : 61.6 HEPARIN DW (KG): 65.8  Vital Signs: Temp: 97.5 F (36.4 C) (11/13 0804) Temp Source: Oral (11/13 0009) BP: 128/49 (11/13 0804) Pulse Rate: 77 (11/13 0804)  Labs: Recent Labs    04/28/24 1958 04/28/24 2105 04/28/24 2325 04/29/24 0549 04/29/24 0841  HGB 13.0  --   --  9.8*  --   HCT 44.5  --   --  33.2*  --   PLT 344  --   --  261  --   APTT  --   --  34  --   --   LABPROT  --   --  16.0*  --   --   INR  --   --  1.2  --   --   HEPARINUNFRC  --   --   --   --  >1.10*  CREATININE 3.36* 3.39*  --   --  2.68*    Estimated Creatinine Clearance: 17.6 mL/min (A) (by C-G formula based on SCr of 2.68 mg/dL (H)).   Medical History: Past Medical History:  Diagnosis Date   Dementia (HCC)    Hypertension     Assessment: Pt is a 73 yo female presenting to ED d/t loss of appetite, found with elevated Troponin level.  Goal of Therapy:  Heparin level 0.3-0.7 units/ml Monitor platelets by anticoagulation protocol: Yes  Date Time HL Rate/Comment 11/13 0841 >1.10 SUPRAtherapeutic @ 850 un/hr   Plan:  Spoke with RN, confirmed no irregularities with draw Hold heparin infusion for 1 hour then restart at 650 un/hr Will check HL 8 hr after restart of infusion CBC daily while on  heparin  Will M. Lenon, PharmD, BCPS Clinical Pharmacist 04/29/2024 10:30 AM

## 2024-04-29 NOTE — Consult Note (Signed)
 [facility] CLINIC CARDIOLOGY CONSULT NOTE       Patient ID: Holly Rocha MRN: [id number] DOB/AGE: 01-24-1951 73 y.o.  Admit date: 04/28/2024 Referring Physician Dr. Madison Peaches Primary Physician Diedra Lame, MD  Primary Cardiologist [facility] (last seen 2018) Reason for Consultation Elevated troponin  HPI: Holly Rocha is a 73 y.o. female  with a past medical history of hypertension, dementia who presented to the ED on 04/28/2024 for worsening mental status and weakness. Troponins found to be elevated. Cardiology was consulted for further evaluation.   Entirety of history is obtained via chart review.  Patient was brought in for evaluation after she was noted to be more altered than her baseline.  Workup in the ED notable for creatinine 3.36, potassium 5.7, sodium >170, chloride > 130, hemoglobin 13.0, WBC 12.4. Troponins 201 > 187. LA 1.7. EKG in the ED NSR rate 88 bpm, no acute ischemic changes. CXR and CT head without acute abnormality. Started on IVF, abx, and IV heparin in the ED.   At the time my evaluation this morning, patient is sleeping peacefully in hospital bed.  She does not arouse to voice.  Unable to answer any questions.  It appears that she was brought from SNF facility due to being more weak and altered than usual.  Also with poor p.o. intake and reduced urine output.  Review of systems complete and found to be negative unless listed above    Past Medical History:  Diagnosis Date   Dementia (HCC)    Hypertension     History reviewed. No pertinent surgical history.  Medications Prior to Admission  Medication Sig Dispense Refill Last Dose/Taking   amLODipine (NORVASC) 5 MG tablet Take 1 tablet by mouth daily.  1    aspirin EC 81 MG tablet Take 1 tablet by mouth daily.      clindamycin  (CLEOCIN ) 300 MG capsule Take 1 capsule (300 mg total) by mouth 4 (four) times daily. 28 capsule 0    donepezil (ARICEPT) 10 MG tablet Take 1 tablet by mouth daily.  0     losartan-hydrochlorothiazide (HYZAAR) 100-25 MG tablet Take 1 tablet by mouth daily.  0    lovastatin (MEVACOR) 40 MG tablet Take 1 tablet by mouth daily with supper.  0    Social History   Socioeconomic History   Marital status: Married    Spouse name: Not on file   Number of children: Not on file   Years of education: Not on file   Highest education level: Not on file  Occupational History   Not on file  Tobacco Use   Smoking status: Some Days   Smokeless tobacco: Not on file  Substance and Sexual Activity   Alcohol use: No   Drug use: Not on file   Sexual activity: Not on file  Other Topics Concern   Not on file  Social History Narrative   Not on file   Social Drivers of Health   Financial Resource Strain: Not on file  Food Insecurity: No Food Insecurity (04/29/2024)   Hunger Vital Sign    Worried About Running Out of Food in the Last Year: Never true    Ran Out of Food in the Last Year: Never true  Transportation Needs: No Transportation Needs (04/29/2024)   PRAPARE - Administrator, Civil Service (Medical): No    Lack of Transportation (Non-Medical): No  Physical Activity: Not on file  Stress: Not on file  Social Connections: Unknown (04/29/2024)  Social Advertising Account Executive    Frequency of Communication with Friends and Family: Once a week    Frequency of Social Gatherings with Friends and Family: Once a week    Attends Religious Services: Not on Marketing Executive or Organizations: No    Attends Banker Meetings: Never    Marital Status: Married  Catering Manager Violence: Not At Risk (04/29/2024)   Humiliation, Afraid, Rape, and Kick questionnaire    Fear of Current or Ex-Partner: No    Emotionally Abused: No    Physically Abused: No    Sexually Abused: No    History reviewed. No pertinent family history.   Vitals:   04/29/24 0009 04/29/24 0506 04/29/24 0532 04/29/24 0804  BP: 114/78 (!) 111/55  (!) 128/49   Pulse: 87 78  77  Resp: 17   16  Temp: (!) 97.1 F (36.2 C) 97.6 F (36.4 C)  (!) 97.5 F (36.4 C)  TempSrc:      SpO2: 98% 100%  100%  Weight:   64.2 kg   Height:        PHYSICAL EXAM General: Chronically ill-appearing elderly female, well nourished, in no acute distress. HEENT: Normocephalic and atraumatic. Neck: No JVD.  Lungs: Normal respiratory effort on room air. Clear bilaterally to auscultation. No wheezes, crackles, rhonchi.  Heart: HRRR. Normal S1 and S2 without gallops or murmurs.  Abdomen: Non-distended appearing.  Msk: Normal strength and tone for age. Extremities: Warm and well perfused. No clubbing, cyanosis.  No edema.  Neuro: Alert and oriented X 3. Psych: Answers questions appropriately.   Labs: Basic Metabolic Panel: Recent Labs    04/28/24 1958 04/28/24 2105  NA >170* >170*  K 5.7* 5.7*  CL >130* >130*  CO2 20* 19*  GLUCOSE 108* 108*  BUN 106* 107*  CREATININE 3.36* 3.39*  CALCIUM 9.6 9.3   Liver Function Tests: Recent Labs    04/28/24 1958  AST 41  ALT 15  ALKPHOS 86  BILITOT 0.3  PROT 7.8  ALBUMIN 3.9   No results for input(s): LIPASE, AMYLASE in the last 72 hours. CBC: Recent Labs    04/28/24 1958 04/29/24 0549  WBC 12.4* 9.2  HGB 13.0 9.8*  HCT 44.5 33.2*  MCV 86.7 86.7  PLT 344 261   Cardiac Enzymes: No results for input(s): CKTOTAL, CKMB, CKMBINDEX, TROPONINIHS in the last 72 hours. BNP: No results for input(s): BNP in the last 72 hours. D-Dimer: No results for input(s): DDIMER in the last 72 hours. Hemoglobin A1C: No results for input(s): HGBA1C in the last 72 hours. Fasting Lipid Panel: No results for input(s): CHOL, HDL, LDLCALC, TRIG, CHOLHDL, LDLDIRECT in the last 72 hours. Thyroid Function Tests: No results for input(s): TSH, T4TOTAL, T3FREE, THYROIDAB in the last 72 hours.  Invalid input(s): FREET3 Anemia Panel: No results for input(s): VITAMINB12, FOLATE,  FERRITIN, TIBC, IRON, RETICCTPCT in the last 72 hours.   Radiology: CT Head Wo Contrast Result Date: 04/28/2024 CLINICAL DATA:  Mental status change EXAM: CT HEAD WITHOUT CONTRAST TECHNIQUE: Contiguous axial images were obtained from the base of the skull through the vertex without intravenous contrast. RADIATION DOSE REDUCTION: This exam was performed according to the departmental dose-optimization program which includes automated exposure control, adjustment of the mA and/or kV according to patient size and/or use of iterative reconstruction technique. COMPARISON:  CT 09/27/2015, MRI Oct 28, 2014 FINDINGS: Brain: No acute territorial infarction, hemorrhage or intracranial mass. Small foci of encephalomalacia  within the bilateral frontal lobes consistent with chronic infarcts. Significant interval atrophy progression, advanced. Mild chronic small vessel ischemic changes of the white matter. Ventricular enlargement likely related to atrophy Vascular: No hyperdense vessels.  Carotid vascular calcification Skull: Normal. Negative for fracture or focal lesion. Sinuses/Orbits: No acute finding. Other: None IMPRESSION: 1. No CT evidence for acute intracranial abnormality. 2. Significant interval atrophy progression since 2017. Mild chronic small vessel ischemic changes of the white matter. 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 Electronically Signed   By: Luke Bun M.D.   On: 04/28/2024 20:44   DG Chest Port 1 View Result Date: 04/28/2024 CLINICAL DATA:  Sepsis. EXAM: PORTABLE CHEST 1 VIEW COMPARISON:  Chest radiograph dated 09/27/2015. FINDINGS: No focal consolidation, pleural effusion or pneumothorax. The cardiac silhouette is within limits. No acute osseous pathology. IMPRESSION: No active disease. Electronically Signed   By: Vanetta Chou M.D.   On: 04/28/2024 20:25    ECHO ordered  TELEMETRY (personally reviewed): sinus rhythm rate 70s  EKG (personally reviewed): NSR  rate 88 bpm, no acute ischemic changes  Data reviewed by me 04/29/2024: last 24h vitals tele labs imaging I/O ED provider note, admission H&P  Principal Problem:   Hypernatremia Active Problems:   Acute kidney injury superimposed on chronic kidney disease   NSTEMI (non-ST elevated myocardial infarction) (HCC)   Dyslipidemia   Essential hypertension    ASSESSMENT AND PLAN:  Holly Rocha is a 73 y.o. female  with a past medical history of hypertension, dementia who presented to the ED on 04/28/2024 for worsening mental status and weakness. Troponins found to be elevated. Cardiology was consulted for further evaluation.   # Demand ischemia # UTI # AKI # Hypernatremia Patient brought in for altered mental status, weakness found to have significant electrolyte derangements with sodium greater than 170.  UA concerning for UTI.  Creatinine significantly elevated and GFR at 14 but no recent baseline for comparison.  Troponins mildly elevated and flat at 201 > 187.  EKG without acute ischemic changes.  Heparin started by admitting team. -Echo ordered for additional evaluation.  Further recommendations pending these results. - Will plan for medical management of her elevated troponins with IV heparin for 24-48 hours pending stability of hemoglobin and platelets. -Mild and flat troponins most consistent with demand/supply mismatch and not ACS. - Further management of AKI, UTI, hyponatremia as per primary team. - Would likely benefit from palliative care evaluation.   This patient's plan of care was discussed and created with Dr. Ammon and he is in agreement.  Signed: Danita Bloch, PA-C  04/29/2024, 9:19 AM [facility] Cardiology

## 2024-04-29 NOTE — TOC CM/SW Note (Signed)
 Transition of Care [facility]) - Inpatient Brief Assessment   Patient Details  Name: KEARIA YIN MRN: [id number] Date of Birth: 1951/02/25  Transition of Care [facility]) CM/SW Contact:    Daved JONETTA Hamilton, RN Phone Number: 04/29/2024, 4:27 PM   Clinical Narrative:   Transition of Care [facility]) Screening Note   Patient Details  Name: JAHNAVI MURATORE Date of Birth: May 08, 1951   Transition of Care [facility]) CM/SW Contact:    Daved JONETTA Hamilton, RN Phone Number: 04/29/2024, 4:27 PM   Transition of Care Department [facility]) has reviewed patient and will continue to follow for discharge assistance.    Transition of Care Asessment: Insurance and Status: Insurance coverage has been reviewed Patient has primary care physician: Yes   Prior level of function:: LTC resident at [employer]: No current home services Social Drivers of Health Review: SDOH reviewed no interventions necessary Readmission risk has been reviewed: Yes Transition of care needs: no transition of care needs at this time

## 2024-04-29 NOTE — Assessment & Plan Note (Signed)
-   Will continue amlodipine, Coreg and hold off Hyzaar.

## 2024-04-29 NOTE — Progress Notes (Signed)
 ANTICOAGULATION CONSULT NOTE  Pharmacy Consult for heparin infusion Indication: ACS/STEMI  Allergies  Allergen Reactions   Penicillins Anaphylaxis    Has patient had a PCN reaction causing immediate rash, facial/tongue/throat swelling, SOB or lightheadedness with hypotension: Yes Has patient had a PCN reaction causing severe rash involving mucus membranes or skin necrosis: No Has patient had a PCN reaction that required hospitalization No Has patient had a PCN reaction occurring within the last 10 years: No If all of the above answers are NO, then may proceed with Cephalosporin use.     Patient Measurements: Height: 5' 7 (170.2 cm) Weight: 64.2 kg (141 lb 8.6 oz) IBW/kg (Calculated) : 61.6 HEPARIN DW (KG): 65.8  Vital Signs: Temp: (P) 100 F (37.8 C) (11/13 2100) Temp Source: (P) Axillary (11/13 2100) BP: (P) 127/87 (11/13 2100) Pulse Rate: (P) 103 (11/13 2100)  Labs: Recent Labs    04/28/24 1958 04/28/24 2105 04/28/24 2325 04/29/24 0549 04/29/24 0841 04/29/24 1328 04/29/24 1446 04/29/24 1800 04/29/24 2139  HGB 13.0  --   --  9.8*  --  9.6*  --   --   --   HCT 44.5  --   --  33.2*  --  32.7*  --   --   --   PLT 344  --   --  261  --  270  --   --   --   APTT  --   --  34  --   --   --   --   --   --   LABPROT  --   --  16.0*  --   --   --   --   --   --   INR  --   --  1.2  --   --   --   --   --   --   HEPARINUNFRC  --   --   --   --  >1.10*  --   --   --  >1.10*  CREATININE 3.36*   < >  --   --  2.68* 2.67* 2.67* 2.67*  --    < > = values in this interval not displayed.    Estimated Creatinine Clearance: 17.7 mL/min (A) (by C-G formula based on SCr of 2.67 mg/dL (H)).   Medical History: Past Medical History:  Diagnosis Date   Dementia (HCC)    Hypertension     Assessment: Pt is a 73 yo female presenting to ED d/t loss of appetite, found with elevated Troponin level.  Goal of Therapy:  Heparin level 0.3-0.7 units/ml Monitor platelets by  anticoagulation protocol: Yes  Date Time HL Rate/Comment 11/13 0841 >1.10 SUPRAtherapeutic @ 850 un/hr 11/13 2139 >1.10 SUPRAtherapeutic @ 650 un/hr   Plan:  Spoke with RN, confirmed no irregularities with draw Hold heparin infusion for 1 hour then restart at 450 un/hr Will check HL 8 hr after restart of infusion CBC daily while on heparin  Galia Rahm A Sylver Vantassell, PharmD Clinical Pharmacist 04/29/2024 10:17 PM

## 2024-04-29 NOTE — Progress Notes (Signed)
 SLP Cancellation Note  Patient Details Name: Holly Rocha MRN: [id number] DOB: 1950-12-04   Cancelled treatment:       Reason Eval/Treat Not Completed: SLP screened, no needs identified, will sign off  Order received for cognitive linguistic evaluation following pt admission for altered mental status. Head CT revealing, No CT evidence for acute intracranial abnormality. Significant interval atrophy progression since 2017. Mild chronic small vessel ischemic changes of the white matter. 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   Pt resting in bed upon therapist arrival. Pt's daughter reports that alertness waxes and wanes. At baseline, pt is minimally verbal (mostly hums). Though is more engaged/alert. Education shared regarding impact of medical condition on mentation and cognition. Suspect time for medical stabilization will aid approximate return to baseline mentation. Daughter reported awareness and understanding. If concerns for cognition arise, recommend follow up at next level of care.   Of note, pt is on a puree diet at baseline. MD and RN notified.   [name], MS, CCC-SLP Speech Language Pathologist Rehab Services; [facility] [phone] (ascom)   [name] [name] 04/29/2024, 11:29 AM

## 2024-04-29 NOTE — Evaluation (Signed)
 Occupational Therapy Evaluation Patient Details Name: Holly Rocha MRN: [id number] DOB: 09-02-1950 Today's Date: 04/29/2024   History of Present Illness   Pt is a 73 year old female presented to the emergency room with acute onset of altered mental status with generalized weakness.    PMH significant for essential hypertension and dementia     Clinical Impressions Chart reviewed to date, pt daughters in room throughout. Pt is lethargic, she does not follow commands. Daughter report she is close to baseline cognition wise, perhaps slightly altered but improved from yesterday. PTA pt is TOTAL A for all ADL/mobility at LTC. She requires a hoyer lift to be transferred. No further OT needs identified. OT will sign off.      If plan is discharge home, recommend the following:   Two people to help with walking and/or transfers;Two people to help with bathing/dressing/bathroom;Supervision due to cognitive status     Functional Status Assessment   Patient has had a recent decline in their functional status and demonstrates the ability to make significant improvements in function in a reasonable and predictable amount of time.     Equipment Recommendations   Other (comment) (defer to next venue of care)     Recommendations for Other Services         Precautions/Restrictions   Precautions Precautions: Fall Recall of Precautions/Restrictions: Impaired     Mobility Bed Mobility                    Transfers                          Balance                                           ADL either performed or assessed with clinical judgement   ADL Overall ADL's : At baseline                                       General ADL Comments: TOTAL A for ADL, TOTAL A +2 for rolling, boost in bed; Pillow placed under L hip for pressure relief     Vision         Perception         Praxis         Pertinent  Vitals/Pain Pain Assessment Pain Assessment: PAINAD Breathing: normal Negative Vocalization: none Facial Expression: smiling or inexpressive Body Language: relaxed Consolability: no need to console PAINAD Score: 0 Pain Intervention(s): Monitored during session     Extremity/Trunk Assessment Upper Extremity Assessment Upper Extremity Assessment: Generalized weakness;RUE deficits/detail RUE Deficits / Details: RUE in a protective position with hand fisted- this is where IV is; nursing aware   Lower Extremity Assessment Lower Extremity Assessment: Generalized weakness       Communication     Cognition Arousal: Lethargic Behavior During Therapy: Flat affect Cognition: History of cognitive impairments             OT - Cognition Comments: Family reports she is close to baseline cognition; Limited direction following; Pt does raise eyebrows when told she is being boosted in bed, will open mouth to eat ice cream  Following commands: Impaired Following commands impaired: Follows one step commands inconsistently     Cueing  General Comments          Exercises Other Exercises Other Exercises: edu pt/family re role of OT, role of rehab   Shoulder Instructions      Home Living Family/patient expects to be discharged to:: Skilled nursing facility                                 Additional Comments: Pt is at LTC at [facility], she requires assist for all ADL, is a nurse, adult transfer      Prior Functioning/Environment Prior Level of Function : Needs assist                    OT Problem List:     OT Treatment/Interventions:        OT Goals(Current goals can be found in the care plan section)       OT Frequency:       Co-evaluation              AM-PAC OT 6 Clicks Daily Activity     Outcome Measure Help from another person eating meals?: Total Help from another person taking care of personal grooming?:  Total Help from another person toileting, which includes using toliet, bedpan, or urinal?: Total Help from another person bathing (including washing, rinsing, drying)?: Total Help from another person to put on and taking off regular upper body clothing?: Total Help from another person to put on and taking off regular lower body clothing?: Total 6 Click Score: 6   End of Session Nurse Communication: Mobility status  Activity Tolerance: Patient tolerated treatment well Patient left: in bed;with call bell/phone within reach;with bed alarm set;with nursing/sitter in room;with family/visitor present                   Time: 1340-1351 OT Time Calculation (min): 11 min Charges:  OT General Charges $OT Visit: 1 Visit OT Evaluation $OT Eval Low Complexity: 1 Low  Therisa Sheffield, OTD OTR/L  04/29/24, 3:23 PM

## 2024-04-30 ENCOUNTER — Inpatient Hospital Stay

## 2024-04-30 DIAGNOSIS — Z515 Encounter for palliative care: Secondary | ICD-10-CM | POA: Diagnosis not present

## 2024-04-30 DIAGNOSIS — N390 Urinary tract infection, site not specified: Secondary | ICD-10-CM | POA: Diagnosis not present

## 2024-04-30 DIAGNOSIS — R7989 Other specified abnormal findings of blood chemistry: Secondary | ICD-10-CM | POA: Diagnosis not present

## 2024-04-30 DIAGNOSIS — Z7189 Other specified counseling: Secondary | ICD-10-CM

## 2024-04-30 DIAGNOSIS — E870 Hyperosmolality and hypernatremia: Secondary | ICD-10-CM | POA: Diagnosis not present

## 2024-04-30 LAB — BASIC METABOLIC PANEL WITH GFR
Anion gap: 9 (ref 5–15)
Anion gap: 9 (ref 5–15)
Anion gap: 10 (ref 5–15)
BUN: 59 mg/dL — ABNORMAL HIGH (ref 8–23)
BUN: 62 mg/dL — ABNORMAL HIGH (ref 8–23)
BUN: 67 mg/dL — ABNORMAL HIGH (ref 8–23)
BUN: 78 mg/dL — ABNORMAL HIGH (ref 8–23)
BUN: 79 mg/dL — ABNORMAL HIGH (ref 8–23)
CO2: 17 mmol/L — ABNORMAL LOW (ref 22–32)
CO2: 17 mmol/L — ABNORMAL LOW (ref 22–32)
CO2: 18 mmol/L — ABNORMAL LOW (ref 22–32)
CO2: 18 mmol/L — ABNORMAL LOW (ref 22–32)
CO2: 18 mmol/L — ABNORMAL LOW (ref 22–32)
Calcium: 8.2 mg/dL — ABNORMAL LOW (ref 8.9–10.3)
Calcium: 8.4 mg/dL — ABNORMAL LOW (ref 8.9–10.3)
Calcium: 8.4 mg/dL — ABNORMAL LOW (ref 8.9–10.3)
Calcium: 8.6 mg/dL — ABNORMAL LOW (ref 8.9–10.3)
Calcium: 8.8 mg/dL — ABNORMAL LOW (ref 8.9–10.3)
Chloride: 129 mmol/L — ABNORMAL HIGH (ref 98–111)
Chloride: 129 mmol/L — ABNORMAL HIGH (ref 98–111)
Chloride: 130 mmol/L — ABNORMAL HIGH (ref 98–111)
Chloride: >130 mmol/L (ref 98–111)
Chloride: >130 mmol/L (ref 98–111)
Creatinine, Ser: 2.14 mg/dL — ABNORMAL HIGH (ref 0.44–1.00)
Creatinine, Ser: 2.19 mg/dL — ABNORMAL HIGH (ref 0.44–1.00)
Creatinine, Ser: 2.33 mg/dL — ABNORMAL HIGH (ref 0.44–1.00)
Creatinine, Ser: 2.79 mg/dL — ABNORMAL HIGH (ref 0.44–1.00)
Creatinine, Ser: 2.83 mg/dL — ABNORMAL HIGH (ref 0.44–1.00)
GFR, Estimated: 17 mL/min — ABNORMAL LOW (ref >60)
GFR, Estimated: 17 mL/min — ABNORMAL LOW (ref >60)
GFR, Estimated: 21 mL/min — ABNORMAL LOW (ref >60)
GFR, Estimated: 23 mL/min — ABNORMAL LOW (ref >60)
GFR, Estimated: 23 mL/min — ABNORMAL LOW (ref >60)
Glucose, Bld: 123 mg/dL — ABNORMAL HIGH (ref 70–99)
Glucose, Bld: 146 mg/dL — ABNORMAL HIGH (ref 70–99)
Glucose, Bld: 92 mg/dL (ref 70–99)
Glucose, Bld: 92 mg/dL (ref 70–99)
Glucose, Bld: 95 mg/dL (ref 70–99)
Potassium: 4.7 mmol/L (ref 3.5–5.1)
Potassium: 4.7 mmol/L (ref 3.5–5.1)
Potassium: 4.9 mmol/L (ref 3.5–5.1)
Potassium: 5.3 mmol/L — ABNORMAL HIGH (ref 3.5–5.1)
Potassium: 5.6 mmol/L — ABNORMAL HIGH (ref 3.5–5.1)
Sodium: 155 mmol/L — ABNORMAL HIGH (ref 135–145)
Sodium: 156 mmol/L — ABNORMAL HIGH (ref 135–145)
Sodium: 157 mmol/L — ABNORMAL HIGH (ref 135–145)
Sodium: 158 mmol/L — ABNORMAL HIGH (ref 135–145)
Sodium: 161 mmol/L (ref 135–145)

## 2024-04-30 LAB — PHOSPHORUS: Phosphorus: 4.1 mg/dL (ref 2.5–4.6)

## 2024-04-30 LAB — MAGNESIUM: Magnesium: 3.1 mg/dL — ABNORMAL HIGH (ref 1.7–2.4)

## 2024-04-30 LAB — CBC
HCT: 32.6 % — ABNORMAL LOW (ref 36.0–46.0)
Hemoglobin: 9.4 g/dL — ABNORMAL LOW (ref 12.0–15.0)
MCH: 25.3 pg — ABNORMAL LOW (ref 26.0–34.0)
MCHC: 28.8 g/dL — ABNORMAL LOW (ref 30.0–36.0)
MCV: 87.9 fL (ref 80.0–100.0)
Platelets: 202 K/uL (ref 150–400)
RBC: 3.71 MIL/uL — ABNORMAL LOW (ref 3.87–5.11)
RDW: 17.7 % — ABNORMAL HIGH (ref 11.5–15.5)
WBC: 9.8 K/uL (ref 4.0–10.5)
nRBC: 0.7 % — ABNORMAL HIGH (ref 0.0–0.2)

## 2024-04-30 LAB — OSMOLALITY, URINE: Osmolality, Ur: 455 mosm/kg (ref 300–900)

## 2024-04-30 LAB — HEPARIN LEVEL (UNFRACTIONATED)
Heparin Unfractionated: 0.78 [IU]/mL — ABNORMAL HIGH (ref 0.30–0.70)
Heparin Unfractionated: 0.43 [IU]/mL (ref 0.30–0.70)

## 2024-04-30 LAB — LIPOPROTEIN A (LPA): Lipoprotein (a): 241.6 nmol/L — ABNORMAL HIGH (ref <75.0)

## 2024-04-30 MED ORDER — ACETAMINOPHEN 10 MG/ML IV SOLN
1000.0000 mg | Freq: Four times a day (QID) | INTRAVENOUS | Status: AC
  Administered 2024-04-30 (×3): 1000 mg via INTRAVENOUS
  Filled 2024-04-30 (×3): qty 100

## 2024-04-30 MED ORDER — DEXTROSE 5 % IV SOLN: INTRAVENOUS | Status: DC

## 2024-04-30 NOTE — NC FL2 (Signed)
 [place]  MEDICAID [id number] LEVEL OF CARE FORM     IDENTIFICATION  Patient Name: Holly Rocha Birthdate: [birth year] Sex: female Admission Date (Current Location): 04/28/2024  [facility] and [place] Number:  Chiropodist and Address:  [facility], [address], [place], [place] [address]      Provider Number: [id number]  Attending Physician Name and Address:  Franchot Novel, MD  Relative Name and Phone Number:  Sharl Browning (Daughter)  [phone]    Current Level of Care: Hospital Recommended Level of Care: Skilled Nursing Facility Prior Approval Number:    Date Approved/Denied:   PASRR Number: [id number]  Discharge Plan: SNF    Current Diagnoses: Patient Active Problem List   Diagnosis Date Noted   Acute kidney injury superimposed on chronic kidney disease 04/29/2024   NSTEMI (non-ST elevated myocardial infarction) (HCC) 04/29/2024   Dyslipidemia 04/29/2024   Essential hypertension 04/29/2024   Hypernatremia 04/28/2024    Orientation RESPIRATION BLADDER Height & Weight     Self, Time    Indwelling catheter Weight: 64.2 kg Height:  5' 7 (170.2 cm)  BEHAVIORAL SYMPTOMS/MOOD NEUROLOGICAL BOWEL NUTRITION STATUS      Incontinent Diet  AMBULATORY STATUS COMMUNICATION OF NEEDS Skin   Extensive Assist Verbally                         Personal Care Assistance Level of Assistance  Bathing, Feeding, Dressing Bathing Assistance: Maximum assistance Feeding assistance: Limited assistance Dressing Assistance: Maximum assistance     Functional Limitations Info             SPECIAL CARE FACTORS FREQUENCY                       Contractures      Additional Factors Info  Code Status, Allergies Code Status Info: DNR Allergies Info: Penicillins           Current Medications (04/30/2024):  This is the current hospital active medication list Current Facility-Administered Medications  Medication Dose Route  Frequency Provider Last Rate Last Admin   acetaminophen (OFIRMEV) IV 1,000 mg  1,000 mg Intravenous Q6H Dail Rankin RAMAN, RPH 400 mL/hr at 04/30/24 0402 1,000 mg at 04/30/24 0402   ALPRAZolam (XANAX) tablet 0.25 mg  0.25 mg Oral BID PRN Mansy, Jan A, MD   0.25 mg at 04/29/24 9771   aspirin EC tablet 81 mg  81 mg Oral Daily Mansy, Jan A, MD   81 mg at 04/29/24 1334   Chlorhexidine Gluconate Cloth 2 % PADS 6 each  6 each Topical Daily Mansy, Jan A, MD   6 each at 04/29/24 1707   heparin ADULT infusion 100 units/mL (25000 units/250mL)  450 Units/hr Intravenous Continuous Hudson, Caralyn, PA-C 4.5 mL/hr at 04/29/24 2338 450 Units/hr at 04/29/24 2338   HYDROmorphone (DILAUDID) injection 0.5-1 mg  0.5-1 mg Intravenous Q3H PRN Franchot Novel, MD   1 mg at 04/30/24 0152   magnesium hydroxide (MILK OF MAGNESIA) suspension 30 mL  30 mL Oral Daily PRN Mansy, Jan A, MD       nitroGLYCERIN (NITROSTAT) SL tablet 0.4 mg  0.4 mg Sublingual Q5 Min x 3 PRN Mansy, Jan A, MD       ondansetron (ZOFRAN) injection 4 mg  4 mg Intravenous Q6H PRN Mansy, Jan A, MD       traZODone (DESYREL) tablet 25 mg  25 mg Oral QHS PRN Mansy, Madison LABOR, MD  25 mg at 04/29/24 0103     Discharge Medications: Please see discharge summary for a list of discharge medications.  Relevant Imaging Results:  Relevant Lab Results:   Additional Information [id number]  Holly GORMAN Fuse, RN

## 2024-04-30 NOTE — Progress Notes (Signed)
 Progress Note   Patient: Holly Rocha FMW:[id number] DOB: 1950-12-06 DOA: 04/28/2024     2 DOS: the patient was seen and examined on 04/30/2024   Brief hospital course: Holly Rocha is a 73 y.o. African-American female with medical history significant for essential hypertension and dementia, presented to the emergency room with acute onset of altered mental status with generalized weakness.  The patient has not been eating or drinking much.  She was more lethargic with decreased responsiveness at [facility] where she resides.  She has been having diminished urine output.  No reported fever or chills.  No reported nausea or vomiting or abdominal pain.  History was obtained from her daughters.   ED Course: When she came to the ER, BP was 125/55 with otherwise normal vital signs.  Labs revealed sodium 117 and chloride more than 130 with a CO2 19 and a BUN of 107 with creatinine of 3.39.  High sensitive troponin I was 201 and later 187.  Serum osmolality was 411 and lactic acid was 1.7. CBC showed leukocytosis 12.4.  UA was positive for UTI.   EKG as reviewed by me : EKG showed sinus rhythm with a rate of 88 with LVH and T wave inversion laterally. 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   The patient was given 1 L bolus of IV normal saline, IV heparin bolus and drip, 4V aspirin, 750 mg IV Levaquin and 1 L bolus of IV lactated Ringer.  She will be admitted to a telemetry bed for further evaluation and management.  Assessment and Plan: * Hypernatremia, severe --Likely related to poor oral intake in the setting of advanced dementia - S/p 1/2 nl saline. Transitioned to D5, improving will continue to slowly correct.  Patient now with  pulmonary edema and oxygen requirement. -Will continue to monitor sodium levels off free water, will resume D5 if sodium is worsening and diuresis well.  Hyperkalemia  Resolved with repeat labs.   Hyperchloremia Likely in setting of severe hypernatremia.  Improving. Will continue to monitor.   Non ischemic myocardial injury (HCC) --Unable to verbalize symptoms, troponin 201>187, EKG without evidence of acute ischemia  - TTE LVEF 60 to 65%, no RWMA, mod LVH, G1dd, RV sys fxn is nl, mild AR/AS - Patient was started on IV heparin - Cardiology consulted, recommended medical management  Normocytic anemia  Per patient's outpt provider hgb is ~9.8-11.  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  Serum creatinine elevated to 3.39 - This is likely secondary to severe dehydration. - Improved with IV fluids per above.   Essential hypertension - Hold her antihypertensives for now.   Dyslipidemia - Hold statin for now.   Possible UTI UA with >50 WBC, and many bacteria also noted to have 11-20 squamous epithelial cells.  She is s/p 1x dose of levaquin.  -Will hold on further treatment as patients worsening mental status likely due to severe hypernatremia.   L laterla hematoma Chornic. Appears stable. Hgb stable. Continue to monitor.   Metabolic acidosis Improving with IV fluids. Because sodium levels so elevated AG not able to be calculated.  -Check VBG in the AM, continue with IV  fluids   Advanced dementia Patient in memory care facility since 2021. Has had progressive decline since then. Is not verbal and not able to perform any iadls/adls independently.   -Hold home aricept  -Family is having goals of care conversation with one another and will likely transition patient to comfort care. Would like to continue current management at this time.       Subjective: Patient is nonverbal. Resting comfortably.   Physical Exam: Vitals:   04/30/24 0409 04/30/24 0815 04/30/24 1212 04/30/24 1611  BP: (!) 120/54 (!) 134/58 (!) 106/42 (!) 141/52  Pulse: 72 67 73 75  Resp: 18 18 16 16   Temp: 97.6 F (36.4 C) 99 F (37.2 C) 99.5 F (37.5 C) 99.5 F (37.5 C)  TempSrc:  Oral Oral Oral  SpO2: 93% 94% 100% 100%  Weight:      Height:        Constitutional: In no distress. Resting comfortably.  Cardiovascular: Normal rate, regular rhythm. No lower extremity edema  Pulmonary: Non labored breathing on Doran, mild rales. No wheezing.,  Abdominal: Soft. Non distended and non tender Musculoskeletal: Large hematoma on lateral aspect of left thigh, stable     Neurological: Does not open eyes Skin: Skin is warm and dry.   Data Reviewed:     Latest Ref Rng & Units 04/30/2024    1:56 PM 04/30/2024    2:53 AM 04/30/2024   12:27 AM  BMP  Glucose 70 - 99 mg/dL 92  876  853   BUN 8 - 23 mg/dL 67  78  79   Creatinine 0.44 - 1.00 mg/dL 7.66  7.20  7.16   Sodium 135 - 145 mmol/L 155  158  161   Potassium 3.5 - 5.1 mmol/L 4.7  5.6  5.3   Chloride 98 - 111 mmol/L 129  >130  >130   CO2 22 - 32 mmol/L 18  18  17    Calcium 8.9 - 10.3 mg/dL 8.4  8.2  8.4       Latest Ref Rng & Units 04/30/2024    4:51 AM 04/29/2024    1:28 PM 04/29/2024    5:49 AM  CBC  WBC 4.0 - 10.5 K/uL 9.8  9.9  9.2   Hemoglobin 12.0 - 15.0 g/dL 9.4  9.6  9.8   Hematocrit 36.0 - 46.0 % 32.6  32.7  33.2   Platelets 150 - 400 K/uL 202  270  261      Family Communication: daughters   Disposition: Status is: Inpatient Remains inpatient appropriate because: severe electrolyte derangemetns   Planned Discharge Destination: memory care facility,     Time spent: 35 minutes  Author: Alban Pepper, MD 04/30/2024 6:19 PM  For on call review [email or web address].

## 2024-04-30 NOTE — Progress Notes (Signed)
 [facility] CLINIC CARDIOLOGY PROGRESS NOTE       Patient ID: Holly Rocha MRN: [id number] DOB/AGE: 1951-01-26 73 y.o.  Admit date: 04/28/2024 Referring Physician Dr. Madison Peaches Primary Physician Diedra Lame, MD  Primary Cardiologist [facility] (last seen 2018) Reason for Consultation Elevated troponin  HPI: Holly Rocha is a 74 y.o. female  with a past medical history of hypertension, dementia who presented to the ED on 04/28/2024 for worsening mental status and weakness. Troponins found to be elevated. Cardiology was consulted for further evaluation.   Interval history: - Patient seen and examined this morning, sleeping peacefully in hospital bed with family at bedside. - BP and heart rate remained stable. - Electrolytes slowly improving.  Review of systems complete and found to be negative unless listed above    Past Medical History:  Diagnosis Date   Dementia (HCC)    Hypertension     History reviewed. No pertinent surgical history.  Medications Prior to Admission  Medication Sig Dispense Refill Last Dose/Taking   Acetaminophen Extra Strength 500 MG TABS Take 2 tablets by mouth 2 (two) times daily.   04/29/2024 at  9:28 AM   cholecalciferol (VITAMIN D3) 25 MCG (1000 UNIT) tablet Take 1,000 Units by mouth daily.   04/28/2024 at  9:25 AM   famotidine (PEPCID) 20 MG tablet Take 20 mg by mouth daily.   04/27/2024   losartan (COZAAR) 50 MG tablet Take 50 mg by mouth daily.   04/28/2024 at  9:25 AM   Menthol-Zinc Oxide 0.44-20.625 % OINT Apply 1 Application topically in the morning and at bedtime. Apply to buttocks   04/28/2024 at  3:39 PM   Multiple Vitamins-Minerals (MULTIVITAMIN WITH MINERALS) tablet Take 1 tablet by mouth daily.   04/28/2024 at  9:25 AM   Nutritional Supplements (JUVEN) POWD Take 1 packet by mouth in the morning and at bedtime.   04/28/2024 at  9:25 AM   polyethylene glycol (MIRALAX / GLYCOLAX) 17 g packet Take 17 g by mouth daily.   04/27/2024    amLODipine (NORVASC) 5 MG tablet Take 1 tablet by mouth daily. (Patient not taking: Reported on 04/29/2024)  1 Not Taking   aspirin EC 81 MG tablet Take 1 tablet by mouth daily. (Patient not taking: Reported on 04/29/2024)   Not Taking   chlorhexidine (PERIDEX) 0.12 % solution Use as directed 15 mLs in the mouth or throat 4 (four) times daily. (Patient not taking: Reported on 04/29/2024)   Not Taking   clindamycin  (CLEOCIN ) 300 MG capsule Take 1 capsule (300 mg total) by mouth 4 (four) times daily. (Patient not taking: Reported on 04/29/2024) 28 capsule 0 Not Taking   donepezil (ARICEPT) 10 MG tablet Take 1 tablet by mouth daily. (Patient not taking: Reported on 04/29/2024)  0 Not Taking   losartan-hydrochlorothiazide (HYZAAR) 100-25 MG tablet Take 1 tablet by mouth daily. (Patient not taking: Reported on 04/29/2024)  0 Not Taking   lovastatin (MEVACOR) 40 MG tablet Take 1 tablet by mouth daily with supper. (Patient not taking: Reported on 04/29/2024)  0 Not Taking   Social History   Socioeconomic History   Marital status: Married    Spouse name: Not on file   Number of children: Not on file   Years of education: Not on file   Highest education level: Not on file  Occupational History   Not on file  Tobacco Use   Smoking status: Some Days   Smokeless tobacco: Not on file  Substance and Sexual  Activity   Alcohol use: No   Drug use: Not on file   Sexual activity: Not on file  Other Topics Concern   Not on file  Social History Narrative   Not on file   Social Drivers of Health   Financial Resource Strain: Not on file  Food Insecurity: No Food Insecurity (04/29/2024)   Hunger Vital Sign    Worried About Running Out of Food in the Last Year: Never true    Ran Out of Food in the Last Year: Never true  Transportation Needs: No Transportation Needs (04/29/2024)   PRAPARE - Administrator, Civil Service (Medical): No    Lack of Transportation (Non-Medical): No  Physical  Activity: Not on file  Stress: Not on file  Social Connections: Unknown (04/29/2024)   Social Connection and Isolation Panel    Frequency of Communication with Friends and Family: Once a week    Frequency of Social Gatherings with Friends and Family: Once a week    Attends Religious Services: Not on Marketing Executive or Organizations: No    Attends Banker Meetings: Never    Marital Status: Married  Catering Manager Violence: Not At Risk (04/29/2024)   Humiliation, Afraid, Rape, and Kick questionnaire    Fear of Current or Ex-Partner: No    Emotionally Abused: No    Physically Abused: No    Sexually Abused: No    History reviewed. No pertinent family history.   Vitals:   04/29/24 2100 04/29/24 2345 04/30/24 0409 04/30/24 0815  BP: (P) 127/87 138/62 (!) 120/54 (!) 134/58  Pulse: (!) (P) 103 86 72 67  Resp:  19 18 18   Temp: (P) 100 F (37.8 C) 99 F (37.2 C) 97.6 F (36.4 C) 99 F (37.2 C)  TempSrc: (P) Axillary Oral  Oral  SpO2:  100% 93% 94%  Weight:      Height:        PHYSICAL EXAM General: Chronically ill-appearing elderly female, well nourished, in no acute distress. HEENT: Normocephalic and atraumatic. Neck: No JVD.  Lungs: Normal respiratory effort on room air. Clear bilaterally to auscultation. No wheezes, crackles, rhonchi.  Heart: HRRR. Normal S1 and S2 without gallops or murmurs.  Abdomen: Non-distended appearing.  Msk: Normal strength and tone for age. Extremities: Warm and well perfused. No clubbing, cyanosis.  No edema.  Neuro: Alert and oriented X 3. Psych: Answers questions appropriately.   Labs: Basic Metabolic Panel: Recent Labs    04/30/24 0027 04/30/24 0253 04/30/24 0451  NA 161* 158*  --   K 5.3* 5.6*  --   CL >130* >130*  --   CO2 17* 18*  --   GLUCOSE 146* 123*  --   BUN 79* 78*  --   CREATININE 2.83* 2.79*  --   CALCIUM 8.4* 8.2*  --   MG  --   --  3.1*  PHOS  --   --  4.1   Liver Function Tests: Recent  Labs    04/28/24 1958  AST 41  ALT 15  ALKPHOS 86  BILITOT 0.3  PROT 7.8  ALBUMIN 3.9   No results for input(s): LIPASE, AMYLASE in the last 72 hours. CBC: Recent Labs    04/29/24 1328 04/30/24 0451  WBC 9.9 9.8  HGB 9.6* 9.4*  HCT 32.7* 32.6*  MCV 87.2 87.9  PLT 270 202   Cardiac Enzymes: No results for input(s): CKTOTAL, CKMB, CKMBINDEX, TROPONINIHS in the  last 72 hours. BNP: No results for input(s): BNP in the last 72 hours. D-Dimer: No results for input(s): DDIMER in the last 72 hours. Hemoglobin A1C: No results for input(s): HGBA1C in the last 72 hours. Fasting Lipid Panel: Recent Labs    04/29/24 0841  CHOL 191  HDL 42  LDLCALC 130*  TRIG 97  CHOLHDL 4.6   Thyroid Function Tests: No results for input(s): TSH, T4TOTAL, T3FREE, THYROIDAB in the last 72 hours.  Invalid input(s): FREET3 Anemia Panel: No results for input(s): VITAMINB12, FOLATE, FERRITIN, TIBC, IRON, RETICCTPCT in the last 72 hours.   Radiology: ECHOCARDIOGRAM COMPLETE Result Date: 04/29/2024    ECHOCARDIOGRAM REPORT   Patient Name:   SHRIYA AKER Date of Exam: 04/29/2024 Medical Rec #:  [id number]          Height:       67.0 in Accession #:    [id number]         Weight:       141.5 lb Date of Birth:  06/03/1951          BSA:          1.746 m Patient Age:    73 years           BP:           128/49 mmHg Patient Gender: F                  HR:           81 bpm. Exam Location:  [facility] Procedure: 2D Echo, Cardiac Doppler, Color Doppler and Intracardiac            Opacification Agent (Both Spectral and Color Flow Doppler were            utilized during procedure). Indications:     NSTEMI  History:         Patient has no prior history of Echocardiogram examinations.                  Risk Factors:Hypertension and Dyslipidemia. CKD.  Sonographer:     Philomena Daring Referring Phys:  [id number] GJW A MANSY Diagnosing Phys: Marsa Dooms MD IMPRESSIONS  1. Left  ventricular ejection fraction, by estimation, is 60 to 65%. The left ventricle has normal function. The left ventricle has no regional wall motion abnormalities. There is moderate left ventricular hypertrophy. Left ventricular diastolic parameters are consistent with Grade I diastolic dysfunction (impaired relaxation).  2. Right ventricular systolic function is normal. The right ventricular size is normal.  3. The mitral valve is normal in structure. No evidence of mitral valve regurgitation. No evidence of mitral stenosis.  4. The aortic valve is normal in structure. Aortic valve regurgitation is mild. Mild aortic valve stenosis.  5. The inferior vena cava is normal in size with greater than 50% respiratory variability, suggesting right atrial pressure of 3 mmHg. FINDINGS  Left Ventricle: Left ventricular ejection fraction, by estimation, is 60 to 65%. The left ventricle has normal function. The left ventricle has no regional wall motion abnormalities. Definity contrast agent was given IV to delineate the left ventricular  endocardial borders. Strain was performed and the global longitudinal strain is indeterminate. The left ventricular internal cavity size was normal in size. There is moderate left ventricular hypertrophy. Left ventricular diastolic parameters are consistent with Grade I diastolic dysfunction (impaired relaxation). Right Ventricle: The right ventricular size is normal. No increase in right ventricular wall thickness. Right ventricular systolic function is  normal. Left Atrium: Left atrial size was normal in size. Right Atrium: Right atrial size was normal in size. Pericardium: There is no evidence of pericardial effusion. Mitral Valve: The mitral valve is normal in structure. No evidence of mitral valve regurgitation. No evidence of mitral valve stenosis. Tricuspid Valve: The tricuspid valve is normal in structure. Tricuspid valve regurgitation is not demonstrated. No evidence of tricuspid  stenosis. Aortic Valve: The aortic valve is normal in structure. Aortic valve regurgitation is mild. Aortic regurgitation PHT measures 636 msec. Mild aortic stenosis is present. Aortic valve mean gradient measures 10.0 mmHg. Aortic valve peak gradient measures 18.4 mmHg. Aortic valve area, by VTI measures 1.34 cm. Pulmonic Valve: The pulmonic valve was normal in structure. Pulmonic valve regurgitation is not visualized. No evidence of pulmonic stenosis. Aorta: The aortic root is normal in size and structure. Venous: The inferior vena cava is normal in size with greater than 50% respiratory variability, suggesting right atrial pressure of 3 mmHg. IAS/Shunts: No atrial level shunt detected by color flow Doppler. Additional Comments: 3D was performed not requiring image post processing on an independent workstation and was indeterminate.  LEFT VENTRICLE PLAX 2D LVIDd:         3.00 cm   Diastology LVIDs:         1.90 cm   LV e' medial:    6.42 cm/s LV PW:         1.10 cm   LV E/e' medial:  10.9 LV IVS:        0.90 cm   LV e' lateral:   7.51 cm/s LVOT diam:     1.90 cm   LV E/e' lateral: 9.3 LV SV:         53 LV SV Index:   30 LVOT Area:     2.84 cm  RIGHT VENTRICLE RV S prime:     14.50 cm/s TAPSE (M-mode): 1.8 cm LEFT ATRIUM             Index        RIGHT ATRIUM           Index LA diam:        2.90 cm 1.66 cm/m   RA Area:     11.20 cm LA Vol (A2C):   29.8 ml 17.07 ml/m  RA Volume:   22.70 ml  13.00 ml/m LA Vol (A4C):   29.4 ml 16.84 ml/m LA Biplane Vol: 29.8 ml 17.07 ml/m  AORTIC VALVE AV Area (Vmax):    1.33 cm AV Area (Vmean):   1.28 cm AV Area (VTI):     1.34 cm AV Vmax:           214.75 cm/s AV Vmean:          145.250 cm/s AV VTI:            0.397 m AV Peak Grad:      18.4 mmHg AV Mean Grad:      10.0 mmHg LVOT Vmax:         100.95 cm/s LVOT Vmean:        65.650 cm/s LVOT VTI:          0.188 m LVOT/AV VTI ratio: 0.47 AI PHT:            636 msec  AORTA Ao Root diam: 2.40 cm MITRAL VALVE                TRICUSPID VALVE MV Area (PHT): 4.49 cm  TR Peak grad:   5.2 mmHg MV Decel Time: 169 msec    TR Vmax:        114.00 cm/s MV E velocity: 70.00 cm/s MV A velocity: 76.00 cm/s  SHUNTS MV E/A ratio:  0.92        Systemic VTI:  0.19 m                            Systemic Diam: 1.90 cm Marsa Dooms MD Electronically signed by Marsa Dooms MD Signature Date/Time: 04/29/2024/1:31:56 PM    Final    CT Head Wo Contrast Result Date: 04/28/2024 CLINICAL DATA:  Mental status change EXAM: CT HEAD WITHOUT CONTRAST TECHNIQUE: Contiguous axial images were obtained from the base of the skull through the vertex without intravenous contrast. RADIATION DOSE REDUCTION: This exam was performed according to the departmental dose-optimization program which includes automated exposure control, adjustment of the mA and/or kV according to patient size and/or use of iterative reconstruction technique. COMPARISON:  CT 09/27/2015, MRI Oct 28, 2014 FINDINGS: Brain: No acute territorial infarction, hemorrhage or intracranial mass. Small foci of encephalomalacia within the bilateral frontal lobes consistent with chronic infarcts. Significant interval atrophy progression, advanced. Mild chronic small vessel ischemic changes of the white matter. Ventricular enlargement likely related to atrophy Vascular: No hyperdense vessels.  Carotid vascular calcification Skull: Normal. Negative for fracture or focal lesion. Sinuses/Orbits: No acute finding. Other: None IMPRESSION: 1. No CT evidence for acute intracranial abnormality. 2. Significant interval atrophy progression since 2017. Mild chronic small vessel ischemic changes of the white matter. 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 Electronically Signed   By: Luke Bun M.D.   On: 04/28/2024 20:44   DG Chest Port 1 View Result Date: 04/28/2024 CLINICAL DATA:  Sepsis. EXAM: PORTABLE CHEST 1 VIEW COMPARISON:  Chest radiograph dated 09/27/2015. FINDINGS: No focal  consolidation, pleural effusion or pneumothorax. The cardiac silhouette is within limits. No acute osseous pathology. IMPRESSION: No active disease. Electronically Signed   By: Vanetta Chou M.D.   On: 04/28/2024 20:25    ECHO as above  TELEMETRY (personally reviewed): sinus rhythm PACs rate 70s  EKG (personally reviewed): NSR rate 88 bpm, no acute ischemic changes  Data reviewed by me 04/30/2024: last 24h vitals tele labs imaging I/O ED provider note, admission H&P, hospitalist progress note  Principal Problem:   Hypernatremia Active Problems:   Acute kidney injury superimposed on chronic kidney disease   NSTEMI (non-ST elevated myocardial infarction) (HCC)   Dyslipidemia   Essential hypertension    ASSESSMENT AND PLAN:  Holly Rocha is a 73 y.o. female  with a past medical history of hypertension, dementia who presented to the ED on 04/28/2024 for worsening mental status and weakness. Troponins found to be elevated. Cardiology was consulted for further evaluation.   # Demand ischemia # UTI # AKI # Hypernatremia Patient brought in for altered mental status, weakness found to have significant electrolyte derangements with sodium greater than 170.  UA concerning for UTI.  Creatinine significantly elevated and GFR at 14 but no recent baseline for comparison.  Troponins mildly elevated and flat at 201 > 187.  EKG without acute ischemic changes.  Heparin started by admitting team.  Echo this admission with a EF 60-65%, no wall motion abnormalities, moderate LVH, grade 1 diastolic dysfunction, mild AS. - Will plan for medical management of her elevated troponins with IV heparin for 24-48 hours pending stability of hemoglobin  and platelets.  Will complete 48 hours at 0130 tomorrow, stop time added to heparin order. -Mild and flat troponins most consistent with demand/supply mismatch and not ACS. - Further management of AKI, UTI, hyponatremia as per primary team. - Would likely  benefit from palliative care evaluation.   This patient's plan of care was discussed and created with Dr. Ammon and he is in agreement.  Signed: Danita Bloch, PA-C  04/30/2024, 10:20 AM [facility] Cardiology

## 2024-04-30 NOTE — TOC Progression Note (Signed)
 Transition of Care [facility]) - Progression Note    Patient Details  Name: Holly Rocha MRN: [id number] Date of Birth: Aug 16, 1950  [facility] CM/SW Contact  Daved JONETTA Hamilton, RN Phone Number: 04/30/2024, 12:59 PM  Clinical Narrative:     Authorization number from [facility] for [facility] transport [id number] 11/14 to 11/17.   Per Grayce at [facility], [facility] is responsible for getting patient prescriptions to [facility] once patient is medically ready for discharge.                     Expected Discharge Plan and Services                                               Social Drivers of Health (SDOH) Interventions SDOH Screenings   Food Insecurity: No Food Insecurity (04/29/2024)  Housing: Low Risk  (04/29/2024)  Transportation Needs: No Transportation Needs (04/29/2024)  Utilities: Not At Risk (04/29/2024)  Social Connections: Unknown (04/29/2024)  Tobacco Use: High Risk (04/28/2024)    Readmission Risk Interventions     No data to display

## 2024-04-30 NOTE — Progress Notes (Signed)
 ANTICOAGULATION CONSULT NOTE  Pharmacy Consult for heparin infusion Indication: ACS/STEMI  Allergies  Allergen Reactions   Penicillins Anaphylaxis    Has patient had a PCN reaction causing immediate rash, facial/tongue/throat swelling, SOB or lightheadedness with hypotension: Yes Has patient had a PCN reaction causing severe rash involving mucus membranes or skin necrosis: No Has patient had a PCN reaction that required hospitalization No Has patient had a PCN reaction occurring within the last 10 years: No If all of the above answers are NO, then may proceed with Cephalosporin use.     Patient Measurements: Height: 5' 7 (170.2 cm) Weight: 64.2 kg (141 lb 8.6 oz) IBW/kg (Calculated) : 61.6 HEPARIN DW (KG): 65.8  Vital Signs: Temp: 99 F (37.2 C) (11/14 0815) Temp Source: Oral (11/14 0815) BP: 134/58 (11/14 0815) Pulse Rate: 67 (11/14 0815)  Labs: Recent Labs    04/28/24 2325 04/29/24 0549 04/29/24 0841 04/29/24 1328 04/29/24 1446 04/29/24 2139 04/30/24 0027 04/30/24 0253 04/30/24 0451 04/30/24 0943  HGB  --  9.8*  --  9.6*  --   --   --   --  9.4*  --   HCT  --  33.2*  --  32.7*  --   --   --   --  32.6*  --   PLT  --  261  --  270  --   --   --   --  202  --   APTT 34  --   --   --   --   --   --   --   --   --   LABPROT 16.0*  --   --   --   --   --   --   --   --   --   INR 1.2  --   --   --   --   --   --   --   --   --   HEPARINUNFRC  --   --  >1.10*  --   --  >1.10*  --   --   --  0.78*  CREATININE  --   --  2.68* 2.67*   < > 2.79* 2.83* 2.79*  --   --    < > = values in this interval not displayed.    Estimated Creatinine Clearance: 16.9 mL/min (A) (by C-G formula based on SCr of 2.79 mg/dL (H)).   Medical History: Past Medical History:  Diagnosis Date   Dementia (HCC)    Hypertension     Assessment: Pt is a 73 yo female presenting to ED d/t loss of appetite, found with elevated Troponin level.  Goal of Therapy:  Heparin level 0.3-0.7  units/ml Monitor platelets by anticoagulation protocol: Yes  Date Time HL Rate/Comment 11/13 0841 >1.10 SUPRAtherapeutic @ 850 un/hr 11/13 2139 >1.10 SUPRAtherapeutic @ 650 un/hr 11/14 0943 0.78 SUPRAtherapeutic @ 450 un/hr   Plan:  Reduce heparin infusion to 350 un/hr Check HL 8 hr following rate change CBC daily while on heparin  Will M. Lenon, PharmD, BCPS Clinical Pharmacist 04/30/2024 10:43 AM

## 2024-04-30 NOTE — TOC Progression Note (Signed)
 Transition of Care [facility]) - Progression Note    Patient Details  Name: Holly Rocha MRN: [id number] Date of Birth: [birth year]  [facility] CM/SW Contact  Dalia GORMAN Fuse, RN Phone Number: 04/30/2024, 10:40 AM  Clinical Narrative:     JANAS completed and sent to MD for signature.  TOC will continue to follow.                    Expected Discharge Plan and Services                                               Social Drivers of Health (SDOH) Interventions SDOH Screenings   Food Insecurity: No Food Insecurity (04/29/2024)  Housing: Low Risk  (04/29/2024)  Transportation Needs: No Transportation Needs (04/29/2024)  Utilities: Not At Risk (04/29/2024)  Social Connections: Unknown (04/29/2024)  Tobacco Use: High Risk (04/28/2024)    Readmission Risk Interventions     No data to display

## 2024-04-30 NOTE — Consult Note (Addendum)
 Consultation Note Date: 04/30/2024   Patient Name: Holly Rocha  DOB: 1950-07-07  MRN: [id number]  Age / Sex: 73 y.o., female  PCP: Diedra Lame, MD Referring Physician: Franchot Novel, MD  Reason for Consultation: Establishing goals of care  HPI/Patient Profile: 73 y.o. female  with past medical history of essential hypertension and dementia  admitted on 04/28/2024 with altered mental status with generalized weakness.   On arrival to the emergency department, her blood pressure was 125/55, and other vital signs were within normal limits. Laboratory results showed severe hyponatremia with sodium at 117, chloride greater than 130, CO? at 19, BUN elevated to 107, and creatinine at 3.39. High-sensitivity troponin I was initially 201 and later decreased to 187. Serum osmolality measured 411, and lactic acid was 1.7. CBC revealed leukocytosis with WBC count of 12.4. Urinalysis was positive for urinary tract infection.  Worth to note that she is a [facility] here in [place], and a resident of [facility] since 2021   PMT has been consulted to assist with goals of care conversation. Patient/Family face treatment option decisions, advanced directive decisions and anticipatory care needs.   Family face treatment option decision, advance directive decisions and anticipatory care needs.   Clinical Assessment and Goals of Care:  I have reviewed medical records including EPIC notes, labs and imaging, assessed the patient and then met with daughters Holly Rocha and Holly Rocha as well as patient's husband at bedside to discuss diagnosis prognosis, GOC, EOL wishes, disposition and options. Initial sodium level was above 170, today it is improved at 158.   I introduced Palliative Medicine as specialized medical care for people living with serious illness. It focuses on providing relief from the symptoms and stress of a serious illness. The goal is to  improve quality of life for both the patient and the family.  Created space and opportunity for family to explore thoughts and feelings regarding patient's current medical condition.    I went to visit the patient at bedside today. Family members were at bedside. I met with her daughters, Holly Rocha and Holly Rocha, and the patient's husband. The patient appeared to be chronically ill, however lying comfortably on her bed. There were no signs of respiratory distress or any forms of acute distress. She is saturating well at 94% on 2 liters of supplemental oxygen via nasal cannula. It appears that she is not in pain or any discomfort. She is non-verbal at baseline.  I received a bedside report from the RN on duty. She reports no significant change in status. The participant continues to be minimally responsive and nonverbal. The RN reports that yesterday the patient was in a lot of pain. I reviewed that the patient is currently on pain medications as needed. She is on Dilaudid 0.5 to 1 mg every 3 hours as needed. She is also on scheduled Tylenol every 6 hours for pain.  I reviewed with the family the reason for the patient's hospital stay. The daughter, Holly Rocha, shared with me that the patient was at the facility Wednesday when family was alerted by the facility staff that the patient was less alert, not eating well, and has had diminished urine output. When the family member arrived at the facility, they found the patient to have cold fingers. [facility] provider [facility] recommended that the patient be transferred to the hospital for medical evaluation.   I review the patient's current medical status and ongoing care plan. Daughter Holly Rocha demonstrate a clear understanding  of the patient's acute illness, the underlying chronic medical conditions, and the reasons for the current hospitalization.  I shared with the family that the patient is currently admitted for severe hypernatremia consistent with inadequate  intake in the setting of advanced dementia. I shared that currently the medical interventions are geared towards medical stabilization to an extent possible.  I discussed and explained the trajectory of dementia and the long-term poor prognosis.   A  discussion was had today regarding advanced directives.  Patient has a preexisting DNR/DNI form along with a living will and health care power of attorney. On that document, she appoints her daughter, Holly Rocha, as the health care power of attorney. Her living will is also very clear in stating that she does not want life-prolonging measures in case of an irreversible medical condition. The difference between a aggressive medical intervention path  and a palliative comfort care path for this patient at this time was had.  Values and goals of care important to patient and family were attempted to be elicited.  The difference between aggressive medical intervention and comfort care was considered in light of the patient's goals of care. Education offered on focusing on a comfort and dignity approach allowing for natural death versus interventions attempting to prolong life when we see that the body is failing to thrive. Hospice and Palliative Care services outpatient were explained and offered. I shared that since  patient is a [facility] participant, that they may have their own end-of-life pathway should family decides to proceed with this route. Natural trajectory and expectations at EOL were discussed. Questions and concerns addressed.    Discussed the importance of continued conversation with family and the medical providers regarding overall plan of care and treatment options, ensuring decisions are within the context of the patient's values and GOCs.   Questions and concerns were addressed.  Hard Choices booklet left for review. The family was encouraged to call with questions or concerns.  PMT will continue to support holistically.  Social  History: The patient is a long-term [facility] in [facility] in [place], enrolled since approximately 2018-2019. She has resided at [facility] since 2021 due to progressive dementia and increasing care needs. She has been married since 1972 and has three children. 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 She previously worked at [employer] for 30 years.   Functional and Nutritional State: Over the past year, she has been non-ambulatory, and her appetite has declined significantly with the advancement of her dementia.  Palliative Symptoms: Generalized weakness, loss of appetite  Advance Directives: A detailed discussion regarding advanced directives was had. Patient   Code Status: DNR-Limited  Primary Decision Maker:  Holly Rocha (daughter)  SUMMARY OF RECOMMENDATIONS   Code Status: Maintain DNR/DNI status Continue with current medical interventions to an extend possible for medical stabilization Family to discuss GOC among themselves, appears that they are leaning towards honoring patient's wishes. Family to update medical team or PMT know of their decision. Continue to provide psycho-social and emotional support to patient and family Palliative medicine team will continue to follow.   Symptom Management: Per Primary team Acetaminophen every 6 hours for pain Alprazolam 0.25 mg PRN for anxiety Hydromorphone 0.5 to 1 mg PRN for severe pain Trazodone 25 mg to PRN for insomnia  Palliative medicine is available to assist as needed.    Palliative Prophylaxis:  Aspiration, Delirium Protocol, Eye Care, Oral  Care, and Turn Reposition   Prognosis:  Patient's prognosis is poor in the setting of advanced dementia, failure to thrive.   Discharge Planning: To Be Determined      Primary Diagnoses: Present on Admission:  Hypernatremia    Physical Exam Vitals and nursing note  reviewed.  Constitutional:      Comments: Sleeping comfortably.   HENT:     Head: Normocephalic and atraumatic.     Mouth/Throat:     Mouth: Mucous membranes are moist.  Cardiovascular:     Rate and Rhythm: Normal rate.  Pulmonary:     Effort: Pulmonary effort is normal.  Abdominal:     General: Abdomen is flat.  Musculoskeletal:     Comments: Generalized weakness   Skin:    General: Skin is warm and dry.  Neurological:     Comments: Non-verbal with baseline dementia      Vital Signs: BP (!) 134/58 (BP Location: Left Arm)   Pulse 67   Temp 99 F (37.2 C) (Oral)   Resp 18   Ht 5' 7 (1.702 m)   Wt 64.2 kg   SpO2 94%   BMI 22.17 kg/m  Pain Scale: Faces   Pain Score: 0-No pain   SpO2: SpO2: 94 % O2 Device:SpO2: 94 % O2 Flow Rate: .O2 Flow Rate (L/min): 2 L/min   Palliative Assessment/Data: 30 to 40%    Total time: I spent 90 minutes in the care of the patient today in the above activities and documenting the encounter.   Detailed review of medical records (labs, imaging, vital signs), medically appropriate exam, discussed with treatment team, counseling and education to patient, family, & staff, documenting clinical information, coordination of care.     Holly JULIANNA Tracie Mickey, NP  Palliative Medicine Team Team phone # [phone]  Thank you for allowing the Palliative Medicine Team to assist in the care of this patient. Please utilize secure chat with additional questions, if there is no response within 30 minutes please call the above phone number.  Palliative Medicine Team providers are available by phone from 7am to 7pm daily and can be reached through the team cell phone.  Should this patient require assistance outside of these hours, please call the patient's attending physician.

## 2024-04-30 NOTE — Plan of Care (Signed)
 Patient has been non verbal, eyes closed throughout my shift. MD aware. Patient is NPO. Family at bedside.  Problem: Education: Goal: Understanding of cardiac disease, CV risk reduction, and recovery process will improve Outcome: Not Progressing Goal: Individualized Educational Video(s) Outcome: Not Progressing   Problem: Activity: Goal: Ability to tolerate increased activity will improve Outcome: Not Progressing   Problem: Cardiac: Goal: Ability to achieve and maintain adequate cardiovascular perfusion will improve Outcome: Not Progressing   Problem: Health Behavior/Discharge Planning: Goal: Ability to safely manage health-related needs after discharge will improve Outcome: Not Progressing   Problem: Nutrition: Goal: Adequate nutrition will be maintained Outcome: Not Progressing

## 2024-04-30 NOTE — Progress Notes (Signed)
 ANTICOAGULATION CONSULT NOTE  Pharmacy Consult for heparin infusion Indication: ACS/STEMI  Allergies  Allergen Reactions   Penicillins Anaphylaxis    Has patient had a PCN reaction causing immediate rash, facial/tongue/throat swelling, SOB or lightheadedness with hypotension: Yes Has patient had a PCN reaction causing severe rash involving mucus membranes or skin necrosis: No Has patient had a PCN reaction that required hospitalization No Has patient had a PCN reaction occurring within the last 10 years: No If all of the above answers are NO, then may proceed with Cephalosporin use.     Patient Measurements: Height: 5' 7 (170.2 cm) Weight: 64.2 kg (141 lb 8.6 oz) IBW/kg (Calculated) : 61.6 HEPARIN DW (KG): 65.8  Vital Signs: Temp: 99.4 F (37.4 C) (11/14 1952) Temp Source: Oral (11/14 1952) BP: 122/50 (11/14 1952) Pulse Rate: 76 (11/14 1952)  Labs: Recent Labs    04/28/24 2325 04/29/24 0549 04/29/24 0841 04/29/24 1328 04/29/24 1446 04/29/24 2139 04/30/24 0027 04/30/24 0253 04/30/24 0451 04/30/24 0943 04/30/24 1356 04/30/24 1940  HGB  --  9.8*  --  9.6*  --   --   --   --  9.4*  --   --   --   HCT  --  33.2*  --  32.7*  --   --   --   --  32.6*  --   --   --   PLT  --  261  --  270  --   --   --   --  202  --   --   --   APTT 34  --   --   --   --   --   --   --   --   --   --   --   LABPROT 16.0*  --   --   --   --   --   --   --   --   --   --   --   INR 1.2  --   --   --   --   --   --   --   --   --   --   --   HEPARINUNFRC  --   --    < >  --   --  >1.10*  --   --   --  0.78*  --  0.43  CREATININE  --   --    < > 2.67*   < > 2.79*   < > 2.79*  --   --  2.33* 2.19*   < > = values in this interval not displayed.    Estimated Creatinine Clearance: 21.6 mL/min (A) (by C-G formula based on SCr of 2.19 mg/dL (H)).   Medical History: Past Medical History:  Diagnosis Date   Dementia (HCC)    Hypertension     Assessment: Pt is a 73 yo female presenting  to ED d/t loss of appetite, found with elevated Troponin level.  Goal of Therapy:  Heparin level 0.3-0.7 units/ml Monitor platelets by anticoagulation protocol: Yes  Date Time HL Rate/Comment 11/13 0841 >1.10 SUPRAtherapeutic @ 850 un/hr 11/13 2139 >1.10 SUPRAtherapeutic @ 650 un/hr 11/14 0943 0.78 SUPRAtherapeutic @ 450 un/hr 11/14 1940 0.43 Therapeutic x1   Plan:  Continue heparin infusion at 350 un/hr Check HL 8 hr CBC daily while on heparin  Annabella LOISE Banks, PharmD Clinical Pharmacist 04/30/2024 8:51 PM

## 2024-05-01 DIAGNOSIS — E870 Hyperosmolality and hypernatremia: Secondary | ICD-10-CM | POA: Diagnosis not present

## 2024-05-01 DIAGNOSIS — Z515 Encounter for palliative care: Secondary | ICD-10-CM | POA: Diagnosis not present

## 2024-05-01 DIAGNOSIS — Z7189 Other specified counseling: Secondary | ICD-10-CM | POA: Diagnosis not present

## 2024-05-01 LAB — BASIC METABOLIC PANEL WITH GFR
BUN: 57 mg/dL — ABNORMAL HIGH (ref 8–23)
CO2: 18 mmol/L — ABNORMAL LOW (ref 22–32)
Calcium: 8.8 mg/dL — ABNORMAL LOW (ref 8.9–10.3)
Chloride: >130 mmol/L (ref 98–111)
Creatinine, Ser: 2.04 mg/dL — ABNORMAL HIGH (ref 0.44–1.00)
GFR, Estimated: 25 mL/min — ABNORMAL LOW (ref >60)
Glucose, Bld: 88 mg/dL (ref 70–99)
Potassium: 5 mmol/L (ref 3.5–5.1)
Sodium: 157 mmol/L — ABNORMAL HIGH (ref 135–145)

## 2024-05-01 LAB — CBC
HCT: 30.7 % — ABNORMAL LOW (ref 36.0–46.0)
Hemoglobin: 9.3 g/dL — ABNORMAL LOW (ref 12.0–15.0)
MCH: 25.7 pg — ABNORMAL LOW (ref 26.0–34.0)
MCHC: 30.3 g/dL (ref 30.0–36.0)
MCV: 84.8 fL (ref 80.0–100.0)
Platelets: 249 K/uL (ref 150–400)
RBC: 3.62 MIL/uL — ABNORMAL LOW (ref 3.87–5.11)
RDW: 17.2 % — ABNORMAL HIGH (ref 11.5–15.5)
WBC: 8.1 K/uL (ref 4.0–10.5)
nRBC: 0.6 % — ABNORMAL HIGH (ref 0.0–0.2)

## 2024-05-01 LAB — BLOOD GAS, VENOUS
Acid-base deficit: 6.7 mmol/L — ABNORMAL HIGH (ref 0.0–2.0)
Bicarbonate: 19.2 mmol/L — ABNORMAL LOW (ref 20.0–28.0)
O2 Saturation: 49.7 %
Patient temperature: 37
pCO2, Ven: 39 mmHg — ABNORMAL LOW (ref 44–60)
pH, Ven: 7.3 (ref 7.25–7.43)
pO2, Ven: 32 mmHg (ref 32–45)

## 2024-05-01 LAB — GLUCOSE, CAPILLARY
Glucose-Capillary: 79 mg/dL (ref 70–99)
Glucose-Capillary: 82 mg/dL (ref 70–99)
Glucose-Capillary: 82 mg/dL (ref 70–99)
Glucose-Capillary: 83 mg/dL (ref 70–99)

## 2024-05-01 LAB — HEPARIN LEVEL (UNFRACTIONATED): Heparin Unfractionated: 0.33 [IU]/mL (ref 0.30–0.70)

## 2024-05-01 MED ORDER — OXYCODONE HCL 20 MG/ML PO CONC
5.0000 mg | ORAL | Status: DC | PRN
  Administered 2024-05-02: 5 mg via SUBLINGUAL

## 2024-05-01 MED ORDER — BIOTENE DRY MOUTH MT LIQD
15.0000 mL | Freq: Two times a day (BID) | OROMUCOSAL | Status: DC
  Administered 2024-05-01 – 2024-05-03 (×4): 15 mL via TOPICAL

## 2024-05-01 MED ORDER — SCOPOLAMINE 1 MG/3DAYS TD PT72
1.0000 | MEDICATED_PATCH | TRANSDERMAL | Status: DC
  Administered 2024-05-01: 1 mg via TRANSDERMAL
  Filled 2024-05-01: qty 1

## 2024-05-01 MED ORDER — LORAZEPAM 2 MG/ML PO CONC
1.0000 mg | ORAL | Status: DC | PRN
Start: 2024-05-01 — End: 2024-05-04

## 2024-05-01 MED ORDER — GLYCOPYRROLATE 0.2 MG/ML IJ SOLN
0.2000 mg | INTRAMUSCULAR | Status: DC | PRN
  Administered 2024-05-01 – 2024-05-02 (×5): 0.2 mg via INTRAVENOUS
  Filled 2024-05-01 (×3): qty 1

## 2024-05-01 MED ORDER — POLYVINYL ALCOHOL 1.4 % OP SOLN: 1.0000 [drp] | Freq: Four times a day (QID) | OPHTHALMIC | Status: DC | PRN

## 2024-05-01 MED ORDER — LORAZEPAM 1 MG PO TABS
1.0000 mg | ORAL_TABLET | ORAL | Status: DC | PRN
Start: 2024-05-01 — End: 2024-05-04

## 2024-05-01 MED ORDER — DIPHENHYDRAMINE HCL 50 MG/ML IJ SOLN: 25.0000 mg | INTRAMUSCULAR | Status: DC | PRN

## 2024-05-01 MED ORDER — ACETAMINOPHEN 650 MG RE SUPP: 650.0000 mg | Freq: Four times a day (QID) | RECTAL | Status: DC | PRN

## 2024-05-01 MED ORDER — ONDANSETRON HCL 4 MG/2ML IJ SOLN: 4.0000 mg | Freq: Four times a day (QID) | INTRAMUSCULAR | Status: DC | PRN

## 2024-05-01 MED ORDER — OXYCODONE HCL 20 MG/ML PO CONC
5.0000 mg | ORAL | Status: DC | PRN
  Filled 2024-05-01: qty 0.3

## 2024-05-01 MED ORDER — GLYCOPYRROLATE 0.2 MG/ML IJ SOLN
0.2000 mg | INTRAMUSCULAR | Status: DC | PRN
  Filled 2024-05-01 (×2): qty 1

## 2024-05-01 MED ORDER — ONDANSETRON 4 MG PO TBDP: 4.0000 mg | ORAL_TABLET | Freq: Four times a day (QID) | ORAL | Status: DC | PRN

## 2024-05-01 MED ORDER — ACETAMINOPHEN 650 MG RE SUPP
650.0000 mg | RECTAL | Status: DC | PRN
  Administered 2024-05-01: 650 mg via RECTAL
  Filled 2024-05-01: qty 1

## 2024-05-01 MED ORDER — ACETAMINOPHEN 325 MG PO TABS: 650.0000 mg | ORAL_TABLET | Freq: Four times a day (QID) | ORAL | Status: DC | PRN

## 2024-05-01 MED ORDER — HALOPERIDOL LACTATE 2 MG/ML PO CONC: 2.0000 mg | Freq: Four times a day (QID) | ORAL | Status: DC | PRN

## 2024-05-01 MED ORDER — GLYCOPYRROLATE 1 MG PO TABS
1.0000 mg | ORAL_TABLET | ORAL | Status: DC | PRN
Start: 2024-05-01 — End: 2024-05-02

## 2024-05-01 MED ORDER — HALOPERIDOL 0.5 MG PO TABS: 2.0000 mg | ORAL_TABLET | Freq: Four times a day (QID) | ORAL | Status: DC | PRN

## 2024-05-01 MED ORDER — HALOPERIDOL LACTATE 5 MG/ML IJ SOLN: 2.0000 mg | Freq: Four times a day (QID) | INTRAMUSCULAR | Status: DC | PRN

## 2024-05-01 MED ORDER — GLYCOPYRROLATE 1 MG PO TABS: 1.0000 mg | ORAL_TABLET | ORAL | Status: DC | PRN

## 2024-05-01 MED ORDER — LORAZEPAM 2 MG/ML IJ SOLN: 1.0000 mg | INTRAMUSCULAR | Status: DC | PRN

## 2024-05-01 MED ORDER — GLYCOPYRROLATE 0.2 MG/ML IJ SOLN
0.2000 mg | INTRAMUSCULAR | Status: DC | PRN
  Administered 2024-05-01: 0.2 mg via INTRAVENOUS
  Filled 2024-05-01: qty 1

## 2024-05-01 MED ORDER — BISACODYL 10 MG RE SUPP
10.0000 mg | Freq: Every day | RECTAL | Status: DC | PRN
  Administered 2024-05-03: 10 mg via RECTAL
  Filled 2024-05-01: qty 1

## 2024-05-01 MED ORDER — HYDROMORPHONE HCL 1 MG/ML IJ SOLN
0.5000 mg | INTRAMUSCULAR | Status: DC | PRN
  Administered 2024-05-01 – 2024-05-03 (×11): 1 mg via INTRAVENOUS
  Filled 2024-05-01 (×11): qty 1

## 2024-05-01 NOTE — Progress Notes (Signed)
 Patient ID: Holly Rocha, female   DOB: 1950-07-03, 73 y.o.   MRN: [id number] [facility] Cardiology    SUBJECTIVE: Patient resting comfortably in bed did not respond to questioning or evaluation has not responded to her family as well   Vitals:   04/30/24 1952 05/01/24 0023 05/01/24 0419 05/01/24 0737  BP: (!) 122/50 (!) 131/50 (!) 127/42 (!) 132/57  Pulse: 76 72 92 97  Resp: 18 18 18 18   Temp: 99.4 F (37.4 C) 98.3 F (36.8 C) 98.3 F (36.8 C) (!) 100.8 F (38.2 C)  TempSrc: Oral Oral Oral Oral  SpO2: 100% 100% 100% (!) 53%  Weight:      Height:         Intake/Output Summary (Last 24 hours) at 05/01/2024 9096 Last data filed at 05/01/2024 0446 Gross per 24 hour  Intake 510.72 ml  Output 1150 ml  Net -639.28 ml      PHYSICAL EXAM Patient lying in bed not responding hemodynamically stable General: Well developed, well nourished, in no acute distress HEENT:  Normocephalic and atramatic Neck:  No JVD.  Lungs: Clear bilaterally to auscultation and percussion. Heart: HRRR . Normal S1 and S2 without gallops or murmurs.  Abdomen: Bowel sounds are positive, abdomen soft and non-tender  Msk:  Back normal, normal gait. Normal strength and tone for age. Extremities: No clubbing, cyanosis or edema.   Neuro: Alert and oriented X 3. Psych:  Good affect, responds appropriately   LABS: Basic Metabolic Panel: Recent Labs    04/30/24 0451 04/30/24 1356 04/30/24 2311 05/01/24 0221  NA  --    < > 157* 157*  K  --    < > 4.7 5.0  CL  --    < > 130* >130*  CO2  --    < > 17* 18*  GLUCOSE  --    < > 95 88  BUN  --    < > 59* 57*  CREATININE  --    < > 2.14* 2.04*  CALCIUM  --    < > 8.6* 8.8*  MG 3.1*  --   --   --   PHOS 4.1  --   --   --    < > = values in this interval not displayed.   Liver Function Tests: Recent Labs    04/28/24 1958  AST 41  ALT 15  ALKPHOS 86  BILITOT 0.3  PROT 7.8  ALBUMIN 3.9   No results for input(s): LIPASE, AMYLASE in the last 72  hours. CBC: Recent Labs    04/30/24 0451 05/01/24 0221  WBC 9.8 8.1  HGB 9.4* 9.3*  HCT 32.6* 30.7*  MCV 87.9 84.8  PLT 202 249   Cardiac Enzymes: No results for input(s): CKTOTAL, CKMB, CKMBINDEX, TROPONINI in the last 72 hours. BNP: Invalid input(s): POCBNP D-Dimer: No results for input(s): DDIMER in the last 72 hours. Hemoglobin A1C: No results for input(s): HGBA1C in the last 72 hours. Fasting Lipid Panel: Recent Labs    04/29/24 0841  CHOL 191  HDL 42  LDLCALC 130*  TRIG 97  CHOLHDL 4.6   Thyroid Function Tests: No results for input(s): TSH, T4TOTAL, T3FREE, THYROIDAB in the last 72 hours.  Invalid input(s): FREET3 Anemia Panel: No results for input(s): VITAMINB12, FOLATE, FERRITIN, TIBC, IRON, RETICCTPCT in the last 72 hours.  DG Chest Port 1 View Result Date: 04/30/2024 CLINICAL DATA:  Dyspnea. EXAM: PORTABLE CHEST 1 VIEW COMPARISON:  04/28/2024. FINDINGS: Low lung volumes. Stable  cardiomediastinal contours. Mild bilateral perihilar interstitial prominence could reflect bronchovascular crowding or central pulmonary vascular congestion. No focal consolidation, sizeable pleural effusion, or pneumothorax. No acute osseous abnormality. IMPRESSION: Low lung volumes with bronchovascular crowding versus central pulmonary vascular congestion. Electronically Signed   By: Harrietta Sherry M.D.   On: 04/30/2024 18:10   ECHOCARDIOGRAM COMPLETE Result Date: 04/29/2024    ECHOCARDIOGRAM REPORT   Patient Name:   Holly Rocha Date of Exam: 04/29/2024 Medical Rec #:  [id number]          Height:       67.0 in Accession #:    [id number]         Weight:       141.5 lb Date of Birth:  [birth year]          BSA:          1.746 m Patient Age:    73 years           BP:           128/49 mmHg Patient Gender: F                  HR:           81 bpm. Exam Location:  [facility] Procedure: 2D Echo, Cardiac Doppler, Color Doppler and Intracardiac             Opacification Agent (Both Spectral and Color Flow Doppler were            utilized during procedure). Indications:     NSTEMI  History:         Patient has no prior history of Echocardiogram examinations.                  Risk Factors:Hypertension and Dyslipidemia. CKD.  Sonographer:     Philomena Daring Referring Phys:  [id number] GJW A MANSY Diagnosing Phys: Marsa Dooms MD IMPRESSIONS  1. Left ventricular ejection fraction, by estimation, is 60 to 65%. The left ventricle has normal function. The left ventricle has no regional wall motion abnormalities. There is moderate left ventricular hypertrophy. Left ventricular diastolic parameters are consistent with Grade I diastolic dysfunction (impaired relaxation).  2. Right ventricular systolic function is normal. The right ventricular size is normal.  3. The mitral valve is normal in structure. No evidence of mitral valve regurgitation. No evidence of mitral stenosis.  4. The aortic valve is normal in structure. Aortic valve regurgitation is mild. Mild aortic valve stenosis.  5. The inferior vena cava is normal in size with greater than 50% respiratory variability, suggesting right atrial pressure of 3 mmHg. FINDINGS  Left Ventricle: Left ventricular ejection fraction, by estimation, is 60 to 65%. The left ventricle has normal function. The left ventricle has no regional wall motion abnormalities. Definity contrast agent was given IV to delineate the left ventricular  endocardial borders. Strain was performed and the global longitudinal strain is indeterminate. The left ventricular internal cavity size was normal in size. There is moderate left ventricular hypertrophy. Left ventricular diastolic parameters are consistent with Grade I diastolic dysfunction (impaired relaxation). Right Ventricle: The right ventricular size is normal. No increase in right ventricular wall thickness. Right ventricular systolic function is normal. Left Atrium: Left atrial size was normal  in size. Right Atrium: Right atrial size was normal in size. Pericardium: There is no evidence of pericardial effusion. Mitral Valve: The mitral valve is normal in structure. No evidence of mitral valve regurgitation. No evidence of mitral valve  stenosis. Tricuspid Valve: The tricuspid valve is normal in structure. Tricuspid valve regurgitation is not demonstrated. No evidence of tricuspid stenosis. Aortic Valve: The aortic valve is normal in structure. Aortic valve regurgitation is mild. Aortic regurgitation PHT measures 636 msec. Mild aortic stenosis is present. Aortic valve mean gradient measures 10.0 mmHg. Aortic valve peak gradient measures 18.4 mmHg. Aortic valve area, by VTI measures 1.34 cm. Pulmonic Valve: The pulmonic valve was normal in structure. Pulmonic valve regurgitation is not visualized. No evidence of pulmonic stenosis. Aorta: The aortic root is normal in size and structure. Venous: The inferior vena cava is normal in size with greater than 50% respiratory variability, suggesting right atrial pressure of 3 mmHg. IAS/Shunts: No atrial level shunt detected by color flow Doppler. Additional Comments: 3D was performed not requiring image post processing on an independent workstation and was indeterminate.  LEFT VENTRICLE PLAX 2D LVIDd:         3.00 cm   Diastology LVIDs:         1.90 cm   LV e' medial:    6.42 cm/s LV PW:         1.10 cm   LV E/e' medial:  10.9 LV IVS:        0.90 cm   LV e' lateral:   7.51 cm/s LVOT diam:     1.90 cm   LV E/e' lateral: 9.3 LV SV:         53 LV SV Index:   30 LVOT Area:     2.84 cm  RIGHT VENTRICLE RV S prime:     14.50 cm/s TAPSE (M-mode): 1.8 cm LEFT ATRIUM             Index        RIGHT ATRIUM           Index LA diam:        2.90 cm 1.66 cm/m   RA Area:     11.20 cm LA Vol (A2C):   29.8 ml 17.07 ml/m  RA Volume:   22.70 ml  13.00 ml/m LA Vol (A4C):   29.4 ml 16.84 ml/m LA Biplane Vol: 29.8 ml 17.07 ml/m  AORTIC VALVE AV Area (Vmax):    1.33 cm AV Area  (Vmean):   1.28 cm AV Area (VTI):     1.34 cm AV Vmax:           214.75 cm/s AV Vmean:          145.250 cm/s AV VTI:            0.397 m AV Peak Grad:      18.4 mmHg AV Mean Grad:      10.0 mmHg LVOT Vmax:         100.95 cm/s LVOT Vmean:        65.650 cm/s LVOT VTI:          0.188 m LVOT/AV VTI ratio: 0.47 AI PHT:            636 msec  AORTA Ao Root diam: 2.40 cm MITRAL VALVE               TRICUSPID VALVE MV Area (PHT): 4.49 cm    TR Peak grad:   5.2 mmHg MV Decel Time: 169 msec    TR Vmax:        114.00 cm/s MV E velocity: 70.00 cm/s MV A velocity: 76.00 cm/s  SHUNTS MV E/A ratio:  0.92  Systemic VTI:  0.19 m                            Systemic Diam: 1.90 cm Marsa Dooms MD Electronically signed by Marsa Dooms MD Signature Date/Time: 04/29/2024/1:31:56 PM    Final      Echo echocardiogram preserved left ventricular function EF around 60%  TELEMETRY: Normal sinus rhythm rate of 70 nonspecific ST-T wave changes:  ASSESSMENT AND PLAN:  Principal Problem:   Hypernatremia Active Problems:   Acute kidney injury superimposed on chronic kidney disease   NSTEMI (non-ST elevated myocardial infarction) (HCC)   Dyslipidemia   Essential hypertension    Plan Elevated troponin consistent with myocardial ischemia non-MI continue conservative therapy Hypertension maintain adequate blood pressure control Elevated troponins are not consistent with myocardial infarction or non-STEMI would recommend conservative management Do not recommend any further cardiac evaluation or studies Cardiology will sign off reconsult if necessary  Cara JONETTA Lovelace, MD, 05/01/2024 9:03 AM

## 2024-05-01 NOTE — Progress Notes (Addendum)
 Palliative Medicine Inpatient Follow Up Note   HPI:  73 y.o. female  with past medical history of essential hypertension and dementia  admitted on 04/28/2024 with altered mental status with generalized weakness.    On arrival to the emergency department, her blood pressure was 125/55, and other vital signs were within normal limits. Laboratory results showed severe hyponatremia with sodium at 117, chloride greater than 130, CO? at 19, BUN elevated to 107, and creatinine at 3.39. High-sensitivity troponin I was initially 201 and later decreased to 187. Serum osmolality measured 411, and lactic acid was 1.7. CBC revealed leukocytosis with WBC count of 12.4. Urinalysis was positive for urinary tract infection.   Worth to note that she is a patient of the [facility] program here in [place], and a resident of [facility] since 2021   Today's Discussion 05/01/2024  Chart reviewed inclusive of vital signs, progress notes, laboratory results, and diagnostic images. Patient is no longer on IV fluids, heparin drip discontinued.   I reviewed her MAR. Noted she received hydromorphone a total of 3.5mg  over the last 24 hours.  Created space and opportunity for patient to explore thoughts feelings and fears regarding current medical situation.  The family are currently navigating decisions regarding treatment options, and anticipatory care planning.  I visited the patient at bedside today, where her two daughters were present. The patient was observed lying comfortably in bed, appearing to sleep without signs of acute distress. No signs indicating patient is in pain. Her daughters noted that she did not engage with the attending physician during morning rounds. She remains NPO. Audible airway secretions were noted during my assessment.  The bedside nurse reported no significant changes over the past 24 hours, aside from oral and airway secretions pooling in the posterior pharynx. Overall, the patient's  clinical status remains unchanged.  Goals of care remain the same, and the current treatment plan will continue. I encouraged ongoing discussions regarding goals of care, with consideration for a comfort-focused approach, including hospice or end-of-life care, given the patient's poor short- and long-term prognosis.  Update 1430: I held an extensive goals-of-care discussion with the patient's family, including daughters Jon Browning, and Arnulfo, along with other relatives present. We reviewed the patient's current medical condition and her very poor short- and long-term prognosis. The family demonstrated a clear understanding of patient's clinical status and acknowledged that this is a terminal situation. I provided detailed education and counseling on the philosophy and benefits of end-of-life care, emphasizing its holistic approach to supporting the patient in the setting of advanced illness and allowing the natural course to proceed.  The family expressed acceptance of a comfort-focused plan and elected to transition to full comfort care. I explained that, as a [facility] participant, the patient may receive hospice or end-of-life services through [facility]. The plan, if the patient remains stable, is to discharge her back to [facility] under the [facility] end-of-life pathway.  I contacted [facility] via the on-call line and spoke with nurse Angie, updating her on the patient's transition to a comfort-focused approach.   Questions and concerns addressed   Palliative Support Provided.   Objective Assessment: Vital Signs Vitals:   05/01/24 0737 05/01/24 1139  BP: (!) 132/57 (!) 121/57  Pulse: 97 (!) 133  Resp: 18 20  Temp: (!) 100.8 F (38.2 C) (!) 101.3 F (38.5 C)  SpO2: (!) 53% 95%    Intake/Output Summary (Last 24 hours) at 05/01/2024 1217 Last data filed at  05/01/2024 0446 Gross per 24 hour  Intake 510.72 ml  Output 1150 ml  Net -639.28 ml   Last  Weight  Most recent update: 04/29/2024  5:32 AM    Weight  64.2 kg (141 lb 8.6 oz)             Physical Exam Vitals and nursing note reviewed.  Constitutional:      Comments: Sleeping comfortably.   HENT:     Head: Normocephalic and atraumatic.     Mouth/Throat:     Mouth: Mucous membranes are moist.  Cardiovascular:     Rate and Rhythm: Normal rate.  Pulmonary:     Effort: Pulmonary effort is normal. Noted for gurgly sound, increased oral secretions.  Abdominal:     General: Abdomen is flat.  Musculoskeletal:     Comments: Generalized weakness   Skin:    General: Skin is warm and dry.  Neurological:     Comments: Non-verbal with baseline dementia  SUMMARY OF RECOMMENDATIONS   Code Status: Transition to DNR-Comfort Continue to provide psycho-social and emotional support to patient and family Palliative medicine team will continue to follow.  Comfort cart for family Unrestricted visitations in the setting of EOL (per policy)  Symptom Management:  Hydromorphone PRN for severe pain/air hunger/comfort Oxycodone PRN for moderate pain Robinul PRN for excessive secretions Ativan PRN for agitation/anxiety Zofran PRN for nausea Liquifilm tears PRN for dry eyes Haldol PRN for agitation/anxiety May have comfort feeding Oxygen PRN 2L or less for comfort. No escalation.      Time Spent: 50 minutes  Detailed review of medical records (labs, imaging, vital signs), medically appropriate exam, discussed with treatment team, counseling and education to patient, family, & staff, documenting clinical information, coordination of care.   ______________________________________________________________________________________ [name] Bolder NP-C [place] Palliative Medicine Team Team Cell Phone: [phone] Please utilize secure chat with additional questions, if there is no response within 30 minutes please call the above phone number  Palliative Medicine Team providers are  available by phone from 7am to 7pm daily and can be reached through the team cell phone.  Should this patient require assistance outside of these hours, please call the patient's attending physician.

## 2024-05-01 NOTE — Plan of Care (Signed)
  Problem: Respiratory: Goal: Verbalizations of increased ease of respirations will increase Outcome: Progressing   Problem: Role Relationship: Goal: Family's ability to cope with current situation will improve Outcome: Progressing Goal: Ability to verbalize concerns, feelings, and thoughts to partner or family member will improve Outcome: Progressing   Problem: Pain Management: Goal: Satisfaction with pain management regimen will improve Outcome: Progressing   

## 2024-05-01 NOTE — Progress Notes (Signed)
 Progress Note   Patient: Holly Rocha FMW:[id number] DOB: 1951/03/06 DOA: 04/28/2024     3 DOS: the patient was seen and examined on 05/01/2024   Brief hospital course: MILDA LINDVALL is a 73 y.o. African-American female with medical history significant for essential hypertension and dementia, presented to the emergency room with acute onset of altered mental status with generalized weakness.  The patient has not been eating or drinking much.  She was more lethargic with decreased responsiveness at [facility] where she resides.  She has been having diminished urine output.  No reported fever or chills.  No reported nausea or vomiting or abdominal pain.  History was obtained from her daughters.   ED Course: When she came to the ER, BP was 125/55 with otherwise normal vital signs.  Labs revealed sodium 117 and chloride more than 130 with a CO2 19 and a BUN of 107 with creatinine of 3.39.  High sensitive troponin I was 201 and later 187.  Serum osmolality was 411 and lactic acid was 1.7. CBC showed leukocytosis 12.4.  UA was positive for UTI.   EKG as reviewed by me : EKG showed sinus rhythm with a rate of 88 with LVH and T wave inversion laterally. 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   The patient was given 1 L bolus of IV normal saline, IV heparin bolus and drip, 4V aspirin, 750 mg IV Levaquin and 1 L bolus of IV lactated Ringer.  She will be admitted to a telemetry bed for further evaluation and management.  Assessment and Plan: * Hypernatremia, severe --Likely related to poor oral intake in the setting of advanced dementia - S/p 1/2 nl saline. Transitioned to D5, and sodium improved.  D5 was held 11/14 as patient developed an  oxygen requirement. Discussed with patient's family, and patient's family had discussions with palliative care team regarding the likelihood of a similar presentation in the near future given patient's advanced dementia and poor oral intake.  Family agreed to transition patient to comfort care.  Hyperkalemia  Resolved on a.m. labs  Hyperchloremia Likely in setting of severe hypernatremia.  Remained elevated.  Monitoring was stopped due to transition to comfort care  Non ischemic myocardial injury [facility]) --Unable to verbalize symptoms, troponin 201>187, EKG without evidence of acute ischemia  - TTE LVEF 60 to 65%, no RWMA, mod LVH, G1dd, RV sys fxn is nl, mild AR/AS - Patient was started on IV heparin - Cardiology consulted, recommended medical management  Normocytic anemia  Per patient's outpt provider hgb is ~9.8-11.  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  Will stop monitoring as patient has been transition to comfort  Essential hypertension Will discontinue antihypertensives given transition to comfort  Dyslipidemia Will continue to hold for now  Possible UTI UA with >50 WBC, and many bacteria also noted to have 11-20 squamous epithelial cells.  She is s/p 1x dose of levaquin.  -Will continue to hold on further treatment   L laterla hematoma [place]. Appears stable. Hgb stable.   Metabolic acidosis Stable this AM. VBG with mildly decreased pH. Will hold on further monitoring given transition to comfort.  Advanced dementia Patient in memory care facility since 2021. Has had progressive decline since then. Is not verbal and not able to perform any iadls/adls independently.   Patient now transitioned to comfort care.      Subjective: Patient is nonverbal and resting comfortably  Physical Exam: Vitals:   05/01/24 0023  05/01/24 0419 05/01/24 0737 05/01/24 1139  BP: (!) 131/50 (!) 127/42 (!) 132/57 (!) 121/57  Pulse: 72 92 97 (!) 133  Resp: 18 18 18 20   Temp: 98.3 F (36.8 C) 98.3 F (36.8 C) (!) 100.8 F (38.2 C) (!) 101.3 F (38.5 C)  TempSrc: Oral Oral Oral Axillary  SpO2: 100% 100% (!) 53% 95%  Weight:      Height:        Constitutional: In no distress.  Cardiovascular: Normal rate, regular rhythm. No lower extremity edema  Pulmonary: Non labored breathing on [place], mild rales   and rhonchi  Abdominal: Soft. Non distended and non tender MSK: Large hematoma of lateral aspect of L thigh stable  Neurological: Opens eyes spontaneously  Skin: Skin is warm and dry.    Data Reviewed:     Latest Ref Rng & Units 05/01/2024    2:21 AM 04/30/2024   11:11 PM 04/30/2024    7:40 PM  BMP  Glucose 70 - 99 mg/dL 88  95  92   BUN 8 - 23 mg/dL 57  59  62   Creatinine 0.44 - 1.00 mg/dL 7.95  7.85  7.80   Sodium 135 - 145 mmol/L 157  157  156   Potassium 3.5 - 5.1 mmol/L 5.0  4.7  4.9   Chloride 98 - 111 mmol/L >130  130  129   CO2 22 - 32 mmol/L 18  17  18    Calcium 8.9 - 10.3 mg/dL 8.8  8.6  8.8       Latest Ref Rng & Units 05/01/2024    2:21 AM 04/30/2024    4:51 AM 04/29/2024    1:28 PM  CBC  WBC 4.0 - 10.5 K/uL 8.1  9.8  9.9   Hemoglobin 12.0 - 15.0 g/dL 9.3  9.4  9.6   Hematocrit 36.0 - 46.0 % 30.7  32.6  32.7   Platelets 150 - 400 K/uL 249  202  270      Family Communication: daughters   Disposition: Status is: Inpatient Remains inpatient appropriate because: trasnition to comfort   Planned Discharge Destination: memory care facility,     Time spent: 35 minutes  Author: Alban Pepper, MD 05/01/2024 3:17 PM  For on call review [email or web address].

## 2024-05-01 NOTE — Plan of Care (Signed)
  Problem: Pain Managment: Goal: General experience of comfort will improve and/or be controlled Outcome: Progressing   Problem: Safety: Goal: Ability to remain free from injury will improve Outcome: Progressing

## 2024-05-01 NOTE — Progress Notes (Signed)
 ANTICOAGULATION CONSULT NOTE  Pharmacy Consult for heparin infusion Indication: ACS/STEMI  Allergies  Allergen Reactions   Penicillins Anaphylaxis    Has patient had a PCN reaction causing immediate rash, facial/tongue/throat swelling, SOB or lightheadedness with hypotension: Yes Has patient had a PCN reaction causing severe rash involving mucus membranes or skin necrosis: No Has patient had a PCN reaction that required hospitalization No Has patient had a PCN reaction occurring within the last 10 years: No If all of the above answers are NO, then may proceed with Cephalosporin use.     Patient Measurements: Height: 5' 7 (170.2 cm) Weight: 64.2 kg (141 lb 8.6 oz) IBW/kg (Calculated) : 61.6 HEPARIN DW (KG): 65.8  Vital Signs: Temp: 98.3 F (36.8 C) (11/15 0419) Temp Source: Oral (11/15 0419) BP: 127/42 (11/15 0419) Pulse Rate: 92 (11/15 0419)  Labs: Recent Labs    04/28/24 2325 04/29/24 0549 04/29/24 1328 04/29/24 1446 04/30/24 0451 04/30/24 0943 04/30/24 1356 04/30/24 1940 04/30/24 2311 05/01/24 0221  HGB  --    < > 9.6*  --  9.4*  --   --   --   --  9.3*  HCT  --    < > 32.7*  --  32.6*  --   --   --   --  30.7*  PLT  --    < > 270  --  202  --   --   --   --  249  APTT 34  --   --   --   --   --   --   --   --   --   LABPROT 16.0*  --   --   --   --   --   --   --   --   --   INR 1.2  --   --   --   --   --   --   --   --   --   HEPARINUNFRC  --    < >  --    < >  --  0.78*  --  0.43  --  0.33  CREATININE  --    < > 2.67*   < >  --   --    < > 2.19* 2.14* 2.04*   < > = values in this interval not displayed.    Estimated Creatinine Clearance: 23.2 mL/min (A) (by C-G formula based on SCr of 2.04 mg/dL (H)).   Medical History: Past Medical History:  Diagnosis Date   Dementia (HCC)    Hypertension     Assessment: Pt is a 73 yo female presenting to ED d/t loss of appetite, found with elevated Troponin level.  Goal of Therapy:  Heparin level 0.3-0.7  units/ml Monitor platelets by anticoagulation protocol: Yes  Date Time HL Rate/Comment 11/13 0841 >1.10 SUPRAtherapeutic @ 850 un/hr 11/13 2139 >1.10 SUPRAtherapeutic @ 650 un/hr 11/14 0943 0.78 SUPRAtherapeutic @ 450 un/hr 11/14 1940 0.43 Therapeutic x 1 11/15 0221 0.33 Therapeutic x 2   Plan:  Continue heparin infusion at 350 un/hr Recheck HL daily w/ AM labs while therapeutic CBC daily while on heparin  Rankin CANDIE Dills, PharmD, [facility] 05/01/2024 4:45 AM

## 2024-05-01 NOTE — TOC Progression Note (Addendum)
 Transition of Care [facility]) - Progression Note    Patient Details  Name: Holly Rocha MRN: [id number] Date of Birth: 10-12-1950  [facility] CM/SW Contact  Marinda Cooks, RN Phone Number: 05/01/2024, 4:54 PM  Clinical Narrative:   This CM called [facility] program and spoke with Leita in intake and was forwarded to weekend on call [facility] MD Dr. Demariano at [phone] and updated MD of pt's family agreeing to new palliative/ comfort care orders . This CM updated that if pt medically cleared tomm on Sunday 05/02/24 DC summary would need to be completed by or before 1:30pm . This CM also update by  [facility] MD Dr. Demarianothat if pt medically cleared Monday covering TOC Cm will need to call [facility] main number to be connected to pt's team to coordinate dc . This CM also confirmed with Admission liaison at [facility] that pt is a resident at facility and be rec'd back when medically cleared . TOC will cont to follow dc planning /care coordination and update as applicable.    Expected Discharge Plan and Services  Back to SNF [facility] with new Palliative / comfort orders .    Social Drivers of Health (SDOH) Interventions SDOH Screenings   Food Insecurity: No Food Insecurity (04/29/2024)  Housing: Low Risk  (04/29/2024)  Transportation Needs: No Transportation Needs (04/29/2024)  Utilities: Not At Risk (04/29/2024)  Social Connections: Unknown (04/29/2024)  Tobacco Use: High Risk (04/28/2024)    Readmission Risk Interventions     No data to display

## 2024-05-02 DIAGNOSIS — E870 Hyperosmolality and hypernatremia: Secondary | ICD-10-CM | POA: Diagnosis not present

## 2024-05-02 DIAGNOSIS — Z515 Encounter for palliative care: Secondary | ICD-10-CM | POA: Diagnosis not present

## 2024-05-02 MED ORDER — GLYCOPYRROLATE 1 MG PO TABS: 1.0000 mg | ORAL_TABLET | ORAL | Status: DC | PRN

## 2024-05-02 MED ORDER — GLYCOPYRROLATE 0.2 MG/ML IJ SOLN: 0.2000 mg | INTRAMUSCULAR | Status: DC | PRN

## 2024-05-02 MED ORDER — GLYCOPYRROLATE 0.2 MG/ML IJ SOLN: 0.4000 mg | INTRAMUSCULAR | Status: DC | PRN

## 2024-05-02 MED ORDER — GLYCOPYRROLATE 0.2 MG/ML IJ SOLN
0.4000 mg | INTRAMUSCULAR | Status: DC | PRN
  Administered 2024-05-02: 0.4 mg via SUBCUTANEOUS
  Filled 2024-05-02: qty 2

## 2024-05-02 MED ORDER — GLYCOPYRROLATE 0.2 MG/ML IJ SOLN
0.4000 mg | INTRAMUSCULAR | Status: DC | PRN
  Administered 2024-05-02 – 2024-05-03 (×6): 0.4 mg via INTRAVENOUS
  Filled 2024-05-02 (×7): qty 2

## 2024-05-02 NOTE — Progress Notes (Signed)
 Palliative Medicine Inpatient Follow Up Note   HPI:  73 y.o. female  with past medical history of essential hypertension and dementia  admitted on 04/28/2024 with altered mental status with generalized weakness.    On arrival to the emergency department, her blood pressure was 125/55, and other vital signs were within normal limits. Laboratory results showed severe hyponatremia with sodium at 117, chloride greater than 130, CO? at 19, BUN elevated to 107, and creatinine at 3.39. High-sensitivity troponin I was initially 201 and later decreased to 187. Serum osmolality measured 411, and lactic acid was 1.7. CBC revealed leukocytosis with WBC count of 12.4. Urinalysis was positive for urinary tract infection.   Worth to note that she is a patient of the [facility] here in [place], and a resident of [facility] since 2021.  On 05/01/2024, after an extensive GOC discussion with family, they have decided to transition patient to comfort-focused pathway, with the understanding that this path is focusing on a comfort and dignity approach allowing for natural death versus interventions attempting to prolong life when we see that the body is failing to thrive.   Today's Discussion 05/02/2024  Chart reviewed inclusive of vital signs, progress notes, laboratory results, and diagnostic images. Patient is no longer on IV fluids, heparin drip discontinued.   I reviewed her MAR. Noted she received hydromorphone a total of 3.5mg  over the last 24 hours.  I visited the patient at bedside today. She appeared to be sleeping comfortably in bed, breathing even and unlabored, without obvious distress or signs of pain. When greeted, she responded minimally before returning to sleep. Her daughter was present at bedside. Noted increased oral secretions with a gurgling sound, likely pooling in the oropharynx. No significant events were reported overnight.  I received a report from the bedside RN indicating  increased oral secretions; a scopolamine patch was initiated by the hospitalist. I advised the RN to trial oral comfort medications and monitor tolerance, as the plan is to discharge the patient back to [facility], which can only administer oral medications (per [facility] on-call MD). I requested to notify the team if the patient cannot tolerate oral medications so this can be communicated to [facility] for alternative discharge planning.  I informed the daughters that I will not be present tomorrow, but another PMT provider will be available to assist and perform symptom checks.  Goals of care remains unchanged. Continue with comfort-focused pathway, plan for discharge tomorrow to [facility]/SNF with [facility] program.   Created space and opportunity for patient to explore thoughts feelings and fears regarding current medical situation.  I visited the patient at bedside today.   Questions and concerns addressed   Palliative Support Provided.   Objective Assessment: Vital Signs Vitals:   05/01/24 1139 05/02/24 0807  BP: (!) 121/57 (!) 109/56  Pulse: (!) 133 (!) 103  Resp: 20 20  Temp: (!) 101.3 F (38.5 C) (!) 100.4 F (38 C)  SpO2: 95% 99%   No intake or output data in the 24 hours ending 05/02/24 0928  Last Weight  Most recent update: 04/29/2024  5:32 AM    Weight  64.2 kg (141 lb 8.6 oz)             Physical Exam Vitals and nursing note reviewed.  Constitutional:      Comments: Sleeping comfortably.   HENT:     Head: Normocephalic and atraumatic.     Mouth/Throat:     Mouth: Mucous membranes are moist.  Cardiovascular:     Rate and Rhythm: Normal rate.  Pulmonary:     Effort: Pulmonary effort is normal. Noted for gurgly sound, increased oral secretions.  Abdominal:     General: Abdomen is flat.  Musculoskeletal:     Comments: Generalized weakness   Skin:    General: Skin is warm and dry.  Neurological:     Comments: Non-verbal with baseline dementia  SUMMARY OF  RECOMMENDATIONS   Code Status: Transition to DNR-Comfort Continue to provide psycho-social and emotional support to patient and family Palliative medicine team will continue to follow.  Comfort cart for family Unrestricted visitations in the setting of EOL (per policy)  Symptom Management:  Hydromorphone PRN for severe pain/air hunger/comfort Oxycodone PRN for moderate pain Robinul PRN for excessive secretions. Increased dose for better control of oral secretions.  Ativan PRN for agitation/anxiety Zofran PRN for nausea Liquifilm tears PRN for dry eyes Haldol PRN for agitation/anxiety Scopolamine patch (added by attending team) for oral secretions May have comfort feeding Oxygen PRN 2L or less for comfort. No escalation.      Time Spent: 35 minutes  Detailed review of medical records (labs, imaging, vital signs), medically appropriate exam, discussed with treatment team, counseling and education to patient, family, & staff, documenting clinical information, coordination of care.   ______________________________________________________________________________________ [name] Bolder NP-C  Palliative Medicine Team Team Cell Phone: [phone] Please utilize secure chat with additional questions, if there is no response within 30 minutes please call the above phone number  Palliative Medicine Team providers are available by phone from 7am to 7pm daily and can be reached through the team cell phone.  Should this patient require assistance outside of these hours, please call the patient's attending physician.

## 2024-05-02 NOTE — Plan of Care (Signed)
  Problem: Respiratory: Goal: Verbalizations of increased ease of respirations will increase Outcome: Progressing   Problem: Role Relationship: Goal: Family's ability to cope with current situation will improve Outcome: Progressing Goal: Ability to verbalize concerns, feelings, and thoughts to partner or family member will improve Outcome: Progressing   Problem: Pain Management: Goal: Satisfaction with pain management regimen will improve Outcome: Progressing   

## 2024-05-02 NOTE — Progress Notes (Signed)
 Progress Note   Patient: Holly Rocha FMW:[id number] DOB: [birth year] DOA: 04/28/2024     4 DOS: the patient was seen and examined on 05/02/2024   Brief hospital course: Holly Rocha is a 73 y.o. African-American female with medical history significant for essential hypertension and dementia, presented to the emergency room with acute onset of altered mental status with generalized weakness.  The patient has not been eating or drinking much.  She was more lethargic with decreased responsiveness at [facility] SN[facility] where she resides.  She has been having diminished urine output.  No reported fever or chills.  No reported nausea or vomiting or abdominal pain.  History was obtained from her daughters.   ED Course: When she came to the ER, BP was 125/55 with otherwise normal vital signs.  Labs revealed sodium 117 and chloride more than 130 with a CO2 19 and a BUN of 107 with creatinine of 3.39.  High sensitive troponin I was 201 and later 187.  Serum osmolality was 411 and lactic acid was 1.7. CBC showed leukocytosis 12.4.  UA was positive for UTI.   EKG as reviewed by me : EKG showed sinus rhythm with a rate of 88 with LVH and T wave inversion laterally. 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   The patient was given 1 L bolus of IV normal saline, IV heparin bolus and drip, 4V aspirin, 750 mg IV Levaquin and 1 L bolus of IV lactated Ringer.  She will be admitted to a telemetry bed for further evaluation and management.  Assessment and Plan: * Hypernatremia, severe --Likely related to poor oral intake in the setting of advanced dementia - S/p 1/2 nl saline. Transitioned to D5, and sodium improved.  D5 was held 11/14 as patient developed an  oxygen requirement. Discussed with patient's family, and patient's family had discussions with palliative care team regarding the likelihood of a similar presentation in the near future given patient's advanced dementia and poor oral intake.  Family agreed to transition patient to comfort care.  Hyperkalemia  Resolved on a.m. labs  Hyperchloremia Likely in setting of severe hypernatremia.  Remained elevated.  Monitoring was stopped due to transition to comfort care  Non ischemic myocardial injury [facility]) --Unable to verbalize symptoms, troponin 201>187, EKG without evidence of acute ischemia  - TTE LVEF 60 to 65%, no RWMA, mod LVH, G1dd, RV sys fxn is nl, mild AR/AS - Patient was started on IV heparin - Cardiology consulted, recommended medical management  Normocytic anemia  Per patient's outpt provider hgb is ~9.8-11.  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  Will stop monitoring as patient has been transition to comfort  Essential hypertension Will discontinue antihypertensives given transition to comfort  Dyslipidemia Will continue to hold for now  Possible UTI UA with >50 WBC, and many bacteria also noted to have 11-20 squamous epithelial cells.  She is s/p 1x dose of levaquin.  -Will continue to hold on further treatment   L laterla hematoma Ch[place]ic. Appears stable. Hgb stable.   Metabolic acidosis Stable this AM. VBG with mildly decreased pH. Will hold on further monitoring given transition to comfort.  Advanced dementia Patient in memory care facility since 2021. Has had progressive decline since then. Is not verbal and not able to perform any iadls/adls independently.   Patient now transitioned to comfort care.      Subjective: Patient is nonverbal and resting comfortably  Physical Exam: Vitals:   05/01/24 0419  05/01/24 0737 05/01/24 1139 05/02/24 0807  BP: (!) 127/42 (!) 132/57 (!) 121/57 (!) 109/56  Pulse: 92 97 (!) 133 (!) 103  Resp: 18 18 20 20   Temp: 98.3 F (36.8 C) (!) 100.8 F (38.2 C) (!) 101.3 F (38.5 C) (!) 100.4 F (38 C)  TempSrc: Oral Oral Axillary Axillary  SpO2: 100% (!) 53% 95% 99%  Weight:      Height:        Physical Exam  In no distress.  Pulmonary: Non labored breathing on [place].   Abdominal: Soft. Non distended      Data Reviewed:     Latest Ref Rng & Units 05/01/2024    2:21 AM 04/30/2024   11:11 PM 04/30/2024    7:40 PM  BMP  Glucose 70 - 99 mg/dL 88  95  92   BUN 8 - 23 mg/dL 57  59  62   Creatinine 0.44 - 1.00 mg/dL 7.95  7.85  7.80   Sodium 135 - 145 mmol/L 157  157  156   Potassium 3.5 - 5.1 mmol/L 5.0  4.7  4.9   Chloride 98 - 111 mmol/L >130  130  129   CO2 22 - 32 mmol/L 18  17  18    Calcium 8.9 - 10.3 mg/dL 8.8  8.6  8.8       Latest Ref Rng & Units 05/01/2024    2:21 AM 04/30/2024    4:51 AM 04/29/2024    1:28 PM  CBC  WBC 4.0 - 10.5 K/uL 8.1  9.8  9.9   Hemoglobin 12.0 - 15.0 g/dL 9.3  9.4  9.6   Hematocrit 36.0 - 46.0 % 30.7  32.6  32.7   Platelets 150 - 400 K/uL 249  202  270      Family Communication: daughters   Disposition: Status is: Inpatient Remains inpatient appropriate because: trasnition to comfort   Planned Discharge Destination: memory care facility,     Time spent: 25 minutes  Author: Alban Pepper, MD 05/02/2024 11:59 AM  For on call review [email or web address].

## 2024-05-02 NOTE — Plan of Care (Signed)
  Problem: Health Behavior/Discharge Planning: Goal: Ability to safely manage health-related needs after discharge will improve Outcome: Progressing   Problem: Pain Managment: Goal: General experience of comfort will improve and/or be controlled Outcome: Progressing   Problem: Safety: Goal: Ability to remain free from injury will improve Outcome: Progressing

## 2024-05-02 NOTE — TOC Progression Note (Signed)
 Transition of Care [facility]) - Progression Note    Patient Details  Name: Holly Rocha MRN: [id number] Date of Birth: 22-Dec-1950  Transition [facility] Care [facility]) CM/SW Contact  Lorraine LILLETTE Fenton, LCSW Phone Number: 05/02/2024, 11:05 AM  Clinical Narrative:    CSW spoke with Hospice care provider from [facility].  Pt expressed that at hospital she receives IV medication but at [facility] only pill form. Pt wants IV at [facility]. Pt having airway difficulty.  Order placed for liquid form of medication .  [name] has spoke to the [place] MD and decision that DC will be Monday, pt can DC with liquid medication if tolerates today. ICM following.     Barriers to Discharge: Continued Medical Work up               Expected Discharge Plan and Services                                               Social Drivers of Health (SDOH) Interventions SDOH Screenings   Food Insecurity: No Food Insecurity (04/29/2024)  Housing: Low Risk  (04/29/2024)  Transportation Needs: No Transportation Needs (04/29/2024)  Utilities: Not At Risk (04/29/2024)  Social Connections: Unknown (04/29/2024)  Tobacco Use: High Risk (04/28/2024)    Readmission Risk Interventions     No data to display

## 2024-05-03 DIAGNOSIS — E870 Hyperosmolality and hypernatremia: Secondary | ICD-10-CM | POA: Diagnosis not present

## 2024-05-03 DIAGNOSIS — Z515 Encounter for palliative care: Secondary | ICD-10-CM | POA: Diagnosis not present

## 2024-05-03 LAB — CULTURE, BLOOD (ROUTINE X 2): Culture: NO GROWTH

## 2024-05-03 MED ORDER — ONDANSETRON 4 MG PO TBDP: 4.0000 mg | ORAL_TABLET | Freq: Four times a day (QID) | ORAL | Status: DC | PRN

## 2024-05-03 MED ORDER — POLYVINYL ALCOHOL 1.4 % OP SOLN: 1.0000 [drp] | Freq: Four times a day (QID) | OPHTHALMIC | Status: DC | PRN

## 2024-05-03 MED ORDER — SCOPOLAMINE 1 MG/3DAYS TD PT72: 1.0000 | MEDICATED_PATCH | TRANSDERMAL | Status: DC

## 2024-05-03 MED ORDER — HYDROMORPHONE HCL 1 MG/ML IJ SOLN: 0.5000 mg | INTRAMUSCULAR | Status: DC | PRN

## 2024-05-03 MED ORDER — GLYCOPYRROLATE 0.2 MG/ML IJ SOLN
0.4000 mg | Freq: Once | INTRAMUSCULAR | Status: AC
  Administered 2024-05-03: 0.4 mg via INTRAVENOUS
  Filled 2024-05-03: qty 2

## 2024-05-03 MED ORDER — DIPHENHYDRAMINE HCL 50 MG/ML IJ SOLN: 25.0000 mg | INTRAMUSCULAR | Status: DC | PRN

## 2024-05-03 MED ORDER — BISACODYL 10 MG RE SUPP: 10.0000 mg | Freq: Every day | RECTAL | Status: DC | PRN

## 2024-05-03 MED ORDER — BIOTENE DRY MOUTH MT LIQD: 15.0000 mL | Freq: Two times a day (BID) | OROMUCOSAL | Status: DC

## 2024-05-03 MED ORDER — HALOPERIDOL 2 MG PO TABS: 2.0000 mg | ORAL_TABLET | Freq: Four times a day (QID) | ORAL | Status: DC | PRN

## 2024-05-03 MED ORDER — GLYCOPYRROLATE 0.2 MG/ML IJ SOLN: 0.4000 mg | INTRAMUSCULAR | Status: DC | PRN

## 2024-05-03 MED ORDER — OXYCODONE HCL 20 MG/ML PO CONC: 5.0000 mg | ORAL | Status: DC | PRN

## 2024-05-03 MED ORDER — LORAZEPAM 1 MG PO TABS: 1.0000 mg | ORAL_TABLET | ORAL | Status: DC | PRN

## 2024-05-17 NOTE — Discharge Summary (Signed)
Physician Discharge Summary   Patient: Holly Rocha MRN: [id number] DOB: 04/19/1951  Admit date:     04/28/2024  Discharge date:   Discharge Physician: Alban Pepper   PCP: Diedra Lame, MD   Recommendations at discharge:    None  Discharge Diagnoses: Principal Problem:   Hypernatremia Active Problems:   NSTEMI (non-ST elevated myocardial infarction) (HCC)   Acute kidney injury superimposed on chronic kidney disease   Dyslipidemia   Essential hypertension  Resolved Problems:   * No resolved hospital problems. *  Hospital Course: Holly Rocha is a 73 y.o. African-American female with medical history significant for essential hypertension and dementia, presented to the emergency room with acute onset of altered mental status with generalized weakness.  The patient has not been eating or drinking much.  She was more lethargic with decreased responsiveness at [facility] where she resides.  She has been having diminished urine output.  No reported fever or chills.  No reported nausea or vomiting or abdominal pain.  History was obtained from her daughters.   ED Course: When she came to the ER, BP was 125/55 with otherwise normal vital signs.  Labs revealed sodium 117 and chloride more than 130 with a CO2 19 and a BUN of 107 with creatinine of 3.39.  High sensitive troponin I was 201 and later 187.  Serum osmolality was 411 and lactic acid was 1.7. CBC showed leukocytosis 12.4.  UA was positive for UTI.   EKG as reviewed by me : EKG showed sinus rhythm with a rate of 88 with LVH and T wave inversion laterally. 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   The patient was given 1 L bolus of IV normal saline, IV heparin  bolus and drip, 4V aspirin, 750 mg IV Levaquin and 1 L bolus of IV lactated Ringer.  She will be admitted to a telemetry bed for further evaluation and management.  Assessment and Plan: End of life care PMT had discussion with patient's family. They would like her to go tohospice home. She was accepted for care there.      Hypernatremia, severe --Likely related to poor oral intake in the setting of advanced dementia - S/p 1/2 nl saline. Transitioned to D5, and sodium improved.  D5 was held 11/14 as patient developed an oxygen requirement. Discussed with patient's family, and patient's family had discussions with palliative care team regarding the likelihood of a similar presentation in the near future given patient's advanced dementia and poor oral intake.  Family agreed to transition patient to comfort care.   Hyperkalemia  Further monitoring stopped due to transition to comfort care.    Hyperchloremia Likely in setting of severe hypernatremia.  Remained elevated.  Monitoring was stopped due to transition to comfort care   Non ischemic myocardial injury [facility]) --Unable to verbalize symptoms, troponin 201>187, EKG without evidence of acute ischemia  - TTE LVEF 60 to 65%, no RWMA, mod LVH, G1dd, RV sys fxn is nl, mild AR/AS - Patient was started on IV heparin - Cardiology consulted, recommended medical management   Normocytic anemia  Per patient's outpt provider hgb is ~9.8-11.  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  Will stop monitoring as patient has been transition to comfort   Essential hypertension Will discontinue antihypertensives given transition to comfort   Dyslipidemia Will continue to hold for now   Possible UTI UA with >50 WBC, and many bacteria also noted to have 11-20 squamous epithelial cells.  She is s/p 1x dose of  levaquin.  -Will continue to hold on further treatment    L laterla hematoma Chornic. Appears stable. Hgb stable.    Metabolic acidosis Stable this AM. VBG with mildly decreased pH. Will hold on further monitoring given transition to comfort.    Advanced dementia Patient in memory care facility since 2021. Has had progressive decline since then. Is not verbal and not able to perform any iadls/adls independently.   Patient now transitioned to comfort care.           Consultants: Palliative care Procedures performed: None  Disposition: Hospice care Diet recommendation:  Regular diet DISCHARGE MEDICATION: Allergies as of 05/14/2024       Reactions   Penicillins Anaphylaxis   Has patient had a PCN reaction causing immediate rash, facial/tongue/throat swelling, SOB or lightheadedness with hypotension: Yes Has patient had a PCN reaction causing severe rash involving mucus membranes or skin necrosis: No Has patient had a PCN reaction that required hospitalization No Has patient had a PCN reaction occurring within the last 10 years: No If all of the above answers are NO, then may proceed with Cephalosporin use.        Medication List     STOP taking these medications    Acetaminophen Extra Strength 500 MG Tabs   amLODipine 5 MG tablet Commonly known as: NORVASC   aspirin EC 81 MG tablet   chlorhexidine 0.12 % solution Commonly known as: PERIDEX   cholecalciferol 25 MCG (1000 UNIT) tablet Commonly known as: VITAMIN D3   clindamycin  300 MG capsule Commonly known as: CLEOCIN    donepezil 10 MG tablet Commonly known as: ARICEPT   famotidine 20 MG tablet Commonly known as: PEPCID   Juven Powd   losartan 50 MG tablet Commonly known as: COZAAR   losartan-hydrochlorothiazide 100-25 MG tablet Commonly known as: HYZAAR   lovastatin 40 MG tablet Commonly known as: MEVACOR   Menthol-Zinc Oxide 0.44-20.625 % Oint   multivitamin with minerals tablet    polyethylene glycol 17 g packet Commonly known as: MIRALAX / GLYCOLAX       TAKE these medications    antiseptic oral rinse Liqd Apply 15 mLs topically 2 (two) times daily.   artificial tears ophthalmic solution Place 1 drop into both eyes 4 (four) times daily as needed for dry eyes.   bisacodyl 10 MG suppository Commonly known as: DULCOLAX Place 1 suppository (10 mg total) rectally daily as needed for moderate constipation.   diphenhydrAMINE 50 MG/ML injection Commonly known as: BENADRYL Inject 0.5 mLs (25 mg total) into the vein every 4 (four) hours as needed for itching, allergies or sleep.   glycopyrrolate 0.2 MG/ML injection Commonly known as: ROBINUL Inject 2 mLs (0.4 mg total) into the vein every 4 (four) hours as needed (excessive secretions).   haloperidol 2 MG tablet Commonly known as: HALDOL Take 1 tablet (2 mg total) by mouth every 6 (six) hours as needed for agitation (or delirium).   HYDROmorphone 1 MG/ML injection Commonly known as: DILAUDID Inject 0.5-2 mLs (0.5-2 mg total) into the vein every 30 (thirty) minutes as needed for severe pain (pain score 7-10) (To alleviate  signs and symptoms of distress).   LORazepam 1 MG tablet Commonly known as: ATIVAN Take 1 tablet (1 mg total) by mouth every hour as needed for anxiety, seizure, sedation or sleep.   ondansetron 4 MG disintegrating tablet Commonly known as: ZOFRAN-ODT Take 1 tablet (4 mg total) by mouth every 6 (six) hours as needed for nausea.   oxyCODONE 20 MG/ML concentrated solution Commonly known as: ROXICODONE INTENSOL Take 0.3 mLs (6 mg total) by mouth every 2 (two) hours as needed for moderate pain (pain score 4-6) (or dyspnea).   scopolamine 1 MG/3DAYS Commonly known as: TRANSDERM-SCOP Place 1 patch (1 mg total) onto the skin every 3 (three) days. Start taking on: May 04, 2024        Follow-up Information     Launie Maiden, Ronal Maxwell, NP. Go in 1 week(s).   Specialty: Nurse  Practitioner Contact information: [address] [place] [address] [phone]                Discharge Exam: Fredricka Weights   04/28/24 1903 04/29/24 0009 04/29/24 0532  Weight: 65.8 kg 65.8 kg 64.2 kg   Physical Exam  Constitutional: In no distress. Chronically ill appearing.  Cardiovascular: Normal rate, regular rhythm. No lower extremity edema  Pulmonary: Non labored breathing on [place]     Neurological: Does not open eyes to voice.  Skin: Skin is warm and dry.    Condition at discharge: worsening  The results of significant diagnostics from this hospitalization (including imaging, microbiology, ancillary and laboratory) are listed below for reference.   Imaging Studies: DG Chest Port 1 View Result Date: 04/30/2024 CLINICAL DATA:  Dyspnea. EXAM: PORTABLE CHEST 1 VIEW COMPARISON:  04/28/2024. FINDINGS: Low lung volumes. Stable cardiomediastinal contours. Mild bilateral perihilar interstitial prominence could reflect bronchovascular crowding or central pulmonary vascular congestion. No focal consolidation, sizeable pleural effusion, or pneumothorax. No acute osseous abnormality. IMPRESSION: Low lung volumes with bronchovascular crowding versus central pulmonary vascular congestion. Electronically Signed   By: Harrietta Sherry M.D.   On: 04/30/2024 18:10   ECHOCARDIOGRAM COMPLETE Result Date: 04/29/2024    ECHOCARDIOGRAM REPORT   Patient Name:   Holly Rocha Date of Exam: 04/29/2024 Medical Rec #:  [id number]          Height:       67.0 in Accession #:    [id number]         Weight:       141.5 lb Date of Birth:  1950/10/11          BSA:          1.746 m Patient Age:    73 years           BP:           128/49 mmHg Patient Gender: F                  HR:           81 bpm. Exam Location:  [facility] Procedure: 2D Echo, Cardiac Doppler, Color Doppler and Intracardiac            Opacification Agent (Both Spectral and Color Flow Doppler were            utilized during procedure).  Indications:     NSTEMI  History:         Patient has no prior history of Echocardiogram examinations.  Risk Factors:Hypertension and Dyslipidemia. CKD.  Sonographer:     Philomena Daring Referring Phys:  [id number] GJW A MANSY Diagnosing Phys: Marsa Dooms MD IMPRESSIONS  1. Left ventricular ejection fraction, by estimation, is 60 to 65%. The left ventricle has normal function. The left ventricle has no regional wall motion abnormalities. There is moderate left ventricular hypertrophy. Left ventricular diastolic parameters are consistent with Grade I diastolic dysfunction (impaired relaxation).  2. Right ventricular systolic function is normal. The right ventricular size is normal.  3. The mitral valve is normal in structure. No evidence of mitral valve regurgitation. No evidence of mitral stenosis.  4. The aortic valve is normal in structure. Aortic valve regurgitation is mild. Mild aortic valve stenosis.  5. The inferior vena cava is normal in size with greater than 50% respiratory variability, suggesting right atrial pressure of 3 mmHg. FINDINGS  Left Ventricle: Left ventricular ejection fraction, by estimation, is 60 to 65%. The left ventricle has normal function. The left ventricle has no regional wall motion abnormalities. Definity contrast agent was given IV to delineate the left ventricular  endocardial borders. Strain was performed and the global longitudinal strain is indeterminate. The left ventricular internal cavity size was normal in size. There is moderate left ventricular hypertrophy. Left ventricular diastolic parameters are consistent with Grade I diastolic dysfunction (impaired relaxation). Right Ventricle: The right ventricular size is normal. No increase in right ventricular wall thickness. Right ventricular systolic function is normal. Left Atrium: Left atrial size was normal in size. Right Atrium: Right atrial size was normal in size. Pericardium: There is no evidence of  pericardial effusion. Mitral Valve: The mitral valve is normal in structure. No evidence of mitral valve regurgitation. No evidence of mitral valve stenosis. Tricuspid Valve: The tricuspid valve is normal in structure. Tricuspid valve regurgitation is not demonstrated. No evidence of tricuspid stenosis. Aortic Valve: The aortic valve is normal in structure. Aortic valve regurgitation is mild. Aortic regurgitation PHT measures 636 msec. Mild aortic stenosis is present. Aortic valve mean gradient measures 10.0 mmHg. Aortic valve peak gradient measures 18.4 mmHg. Aortic valve area, by VTI measures 1.34 cm. Pulmonic Valve: The pulmonic valve was normal in structure. Pulmonic valve regurgitation is not visualized. No evidence of pulmonic stenosis. Aorta: The aortic root is normal in size and structure. Venous: The inferior vena cava is normal in size with greater than 50% respiratory variability, suggesting right atrial pressure of 3 mmHg. IAS/Shunts: No atrial level shunt detected by color flow Doppler. Additional Comments: 3D was performed not requiring image post processing on an independent workstation and was indeterminate.  LEFT VENTRICLE PLAX 2D LVIDd:         3.00 cm   Diastology LVIDs:         1.90 cm   LV e' medial:    6.42 cm/s LV PW:         1.10 cm   LV E/e' medial:  10.9 LV IVS:        0.90 cm   LV e' lateral:   7.51 cm/s LVOT diam:     1.90 cm   LV E/e' lateral: 9.3 LV SV:         53 LV SV Index:   30 LVOT Area:     2.84 cm  RIGHT VENTRICLE RV S prime:     14.50 cm/s TAPSE (M-mode): 1.8 cm LEFT ATRIUM             Index  RIGHT ATRIUM           Index LA diam:        2.90 cm 1.66 cm/m   RA Area:     11.20 cm LA Vol (A2C):   29.8 ml 17.07 ml/m  RA Volume:   22.70 ml  13.00 ml/m LA Vol (A4C):   29.4 ml 16.84 ml/m LA Biplane Vol: 29.8 ml 17.07 ml/m  AORTIC VALVE AV Area (Vmax):    1.33 cm AV Area (Vmean):   1.28 cm AV Area (VTI):     1.34 cm AV Vmax:           214.75 cm/s AV Vmean:           145.250 cm/s AV VTI:            0.397 m AV Peak Grad:      18.4 mmHg AV Mean Grad:      10.0 mmHg LVOT Vmax:         100.95 cm/s LVOT Vmean:        65.650 cm/s LVOT VTI:          0.188 m LVOT/AV VTI ratio: 0.47 AI PHT:            636 msec  AORTA Ao Root diam: 2.40 cm MITRAL VALVE               TRICUSPID VALVE MV Area (PHT): 4.49 cm    TR Peak grad:   5.2 mmHg MV Decel Time: 169 msec    TR Vmax:        114.00 cm/s MV E velocity: 70.00 cm/s MV A velocity: 76.00 cm/s  SHUNTS MV E/A ratio:  0.92        Systemic VTI:  0.19 m                            Systemic Diam: 1.90 cm Marsa Dooms MD Electronically signed by Marsa Dooms MD Signature Date/Time: 04/29/2024/1:31:56 PM    Final    CT Head Wo Contrast Result Date: 04/28/2024 CLINICAL DATA:  Mental status change EXAM: CT HEAD WITHOUT CONTRAST TECHNIQUE: Contiguous axial images were obtained from the base of the skull through the vertex without intravenous contrast. RADIATION DOSE REDUCTION: This exam was performed according to the departmental dose-optimization program which includes automated exposure control, adjustment of the mA and/or kV according to patient size and/or use of iterative reconstruction technique. COMPARISON:  CT 09/27/2015, MRI Oct 28, 2014 FINDINGS: Brain: No acute territorial infarction, hemorrhage or intracranial mass. Small foci of encephalomalacia within the bilateral frontal lobes consistent with chronic infarcts. Significant interval atrophy progression, advanced. Mild chronic small vessel ischemic changes of the white matter. Ventricular enlargement likely related to atrophy Vascular: No hyperdense vessels.  Carotid vascular calcification Skull: Normal. Negative for fracture or focal lesion. Sinuses/Orbits: No acute finding. Other: None IMPRESSION: 1. No CT evidence for acute intracranial abnormality. 2. Significant interval atrophy progression since 2017. Mild chronic small vessel ischemic changes of the white matter.  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 Electronically Signed   By: Luke Bun M.D.   On: 04/28/2024 20:44   DG Chest Port 1 View Result Date: 04/28/2024 CLINICAL DATA:  Sepsis. EXAM: PORTABLE CHEST 1 VIEW COMPARISON:  Chest radiograph dated 09/27/2015. FINDINGS: No focal consolidation, pleural effusion or pneumothorax. The cardiac silhouette is within limits. No acute osseous pathology. IMPRESSION: No active disease. Electronically Signed   By:  Vanetta Chou M.D.   On: 04/28/2024 20:25    Microbiology: Results for orders placed or performed during the hospital encounter of 04/28/24  Blood Culture (routine x 2)     Status: Abnormal   Collection Time: 04/28/24  8:41 PM   Specimen: BLOOD  Result Value Ref Range Status   Specimen Description   Final    BLOOD BLOOD RIGHT ARM Performed at [facility], [address] [place], [place] [address]    Special Requests   Final    BOTTLES DRAWN AEROBIC AND ANAEROBIC Blood Culture results may not be optimal due to an inadequate volume of blood received in culture bottles Performed at [facility], [address], [place], [place] [address]    Culture  Setup Time   Final    GRAM POSITIVE COCCI IN BOTH AEROBIC AND ANAEROBIC BOTTLES CRITICAL RESULT CALLED TO, READ BACK BY AND VERIFIED WITH: TREY GREENWOOD 04/29/24 1312 MW GRAM STAIN REVIEWED-AGREE WITH RESULT DRT Performed at [facility], [address] [place], [place] [address]    Culture (A)  Final    STAPHYLOCOCCUS AURICULARIS THE SIGNIFICANCE OF ISOLATING THIS ORGANISM FROM A SINGLE SET OF BLOOD CULTURES WHEN MULTIPLE SETS ARE DRAWN IS UNCERTAIN. PLEASE NOTIFY THE MICROBIOLOGY DEPARTMENT WITHIN ONE WEEK IF SPECIATION AND SENSITIVITIES ARE REQUIRED. Performed at [facility], [address], [place], [place] [address]    Report Status 05/06/2024 FINAL  Final  Blood Culture (routine x 2)     Status: None   Collection Time: 04/28/24   8:41 PM   Specimen: BLOOD  Result Value Ref Range Status   Specimen Description BLOOD BLOOD LEFT ARM  Final   Special Requests   Final    BOTTLES DRAWN AEROBIC AND ANAEROBIC Blood Culture results may not be optimal due to an inadequate volume of blood received in culture bottles   Culture   Final    NO GROWTH 5 DAYS Performed at [facility], [address] [place], [place] [address]    Report Status 05/06/2024 FINAL  Final  Resp panel by RT-PCR (RSV, Flu A&B, Covid) Anterior Nasal Swab     Status: None   Collection Time: 04/28/24  8:41 PM   Specimen: Anterior Nasal Swab  Result Value Ref Range Status   SARS Coronavirus 2 by RT PCR NEGATIVE NEGATIVE Final    Comment: (NOTE) SARS-CoV-2 target nucleic acids are NOT DETECTED.  The SARS-CoV-2 RNA is generally detectable in upper respiratory specimens during the acute phase of infection. The lowest concentration of SARS-CoV-2 viral copies this assay can detect is 138 copies/mL. A negative result does not preclude SARS-Cov-2 infection and should not be used as the sole basis for treatment or other patient management decisions. A negative result may occur with  improper specimen collection/handling, submission of specimen other than nasopharyngeal swab, presence of viral mutation(s) within the areas targeted by this assay, and inadequate number of viral copies(<138 copies/mL). A negative result must be combined with clinical observations, patient history, and epidemiological information. The expected result is Negative.  Fact Sheet for Patients:  bloggercourse.com  Fact Sheet for Healthcare Providers:  seriousbroker.it  This test is no t yet approved or cleared by the [place]  FDA and  has been authorized for detection and/or diagnosis of SARS-CoV-2 by FDA under an Emergency Use Authorization (EUA). This EUA will remain  in effect (meaning this test can be used) for  the duration of the COVID-19 declaration under Section  564(b)(1) of the Act, 21 U.S.C.section 360bbb-3(b)(1), unless the authorization is terminated  or revoked sooner.       Influenza A by PCR NEGATIVE NEGATIVE Final   Influenza B by PCR NEGATIVE NEGATIVE Final    Comment: (NOTE) The Xpert Xpress SARS-CoV-2/FLU/RSV plus assay is intended as an aid in the diagnosis of influenza from Nasopharyngeal swab specimens and should not be used as a sole basis for treatment. Nasal washings and aspirates are unacceptable for Xpert Xpress SARS-CoV-2/FLU/RSV testing.  Fact Sheet for Patients: bloggercourse.com  Fact Sheet for Healthcare Providers: seriousbroker.it  This test is not yet approved or cleared by the [place]  FDA and has been authorized for detection and/or diagnosis of SARS-CoV-2 by FDA under an Emergency Use Authorization (EUA). This EUA will remain in effect (meaning this test can be used) for the duration of the COVID-19 declaration under Section 564(b)(1) of the Act, 21 U.S.C. section 360bbb-3(b)(1), unless the authorization is terminated or revoked.     Resp Syncytial Virus by PCR NEGATIVE NEGATIVE Final    Comment: (NOTE) Fact Sheet for Patients: bloggercourse.com  Fact Sheet for Healthcare Providers: seriousbroker.it  This test is not yet approved or cleared by the [place]  FDA and has been authorized for detection and/or diagnosis of SARS-CoV-2 by FDA under an Emergency Use Authorization (EUA). This EUA will remain in effect (meaning this test can be used) for the duration of the COVID-19 declaration under Section 564(b)(1) of the Act, 21 U.S.C. section 360bbb-3(b)(1), unless the authorization is terminated or revoked.  Performed at [facility], [address] [place], [place] [address]   Blood Culture ID Panel (Reflexed)     Status:  Abnormal   Collection Time: 04/28/24  8:41 PM  Result Value Ref Range Status   Enterococcus faecalis NOT DETECTED NOT DETECTED Final   Enterococcus Faecium NOT DETECTED NOT DETECTED Final   Listeria monocytogenes NOT DETECTED NOT DETECTED Final   Staphylococcus species DETECTED (A) NOT DETECTED Final    Comment: CRITICAL RESULT CALLED TO, READ BACK BY AND VERIFIED WITH: TREY GREENWOOD 04/29/24 1312 MW    Staphylococcus aureus (BCID) NOT DETECTED NOT DETECTED Final   Staphylococcus epidermidis NOT DETECTED NOT DETECTED Final   Staphylococcus lugdunensis NOT DETECTED NOT DETECTED Final   Streptococcus species NOT DETECTED NOT DETECTED Final   Streptococcus agalactiae NOT DETECTED NOT DETECTED Final   Streptococcus pneumoniae NOT DETECTED NOT DETECTED Final   Streptococcus pyogenes NOT DETECTED NOT DETECTED Final   A.calcoaceticus-baumannii NOT DETECTED NOT DETECTED Final   Bacteroides fragilis NOT DETECTED NOT DETECTED Final   Enterobacterales NOT DETECTED NOT DETECTED Final   Enterobacter cloacae complex NOT DETECTED NOT DETECTED Final   Escherichia coli NOT DETECTED NOT DETECTED Final   Klebsiella aerogenes NOT DETECTED NOT DETECTED Final   Klebsiella oxytoca NOT DETECTED NOT DETECTED Final   Klebsiella pneumoniae NOT DETECTED NOT DETECTED Final   Proteus species NOT DETECTED NOT DETECTED Final   Salmonella species NOT DETECTED NOT DETECTED Final   Serratia marcescens NOT DETECTED NOT DETECTED Final   Haemophilus influenzae NOT DETECTED NOT DETECTED Final   Neisseria meningitidis NOT DETECTED NOT DETECTED Final   Pseudomonas aeruginosa NOT DETECTED NOT DETECTED Final   Stenotrophomonas maltophilia NOT DETECTED NOT DETECTED Final   Candida albicans NOT DETECTED NOT DETECTED Final   Candida auris NOT DETECTED NOT DETECTED Final   Candida glabrata NOT DETECTED NOT DETECTED Final   Candida krusei NOT DETECTED NOT DETECTED Final   Candida parapsilosis NOT DETECTED  NOT DETECTED Final    Candida tropicalis NOT DETECTED NOT DETECTED Final   Cryptococcus neoformans/gattii NOT DETECTED NOT DETECTED Final    Comment: Performed at [facility], [address] [place], [place] [address]    Labs: CBC: Recent Labs  Lab 04/28/24 1958 04/29/24 0549 04/29/24 1328 04/30/24 0451 05/01/24 0221  WBC 12.4* 9.2 9.9 9.8 8.1  HGB 13.0 9.8* 9.6* 9.4* 9.3*  HCT 44.5 33.2* 32.7* 32.6* 30.7*  MCV 86.7 86.7 87.2 87.9 84.8  PLT 344 261 270 202 249   Basic Metabolic Panel: Recent Labs  Lab 04/30/24 0253 04/30/24 0451 04/30/24 1356 04/30/24 1940 04/30/24 2311 05/01/24 0221  NA 158*  --  155* 156* 157* 157*  K 5.6*  --  4.7 4.9 4.7 5.0  CL >130*  --  129* 129* 130* >130*  CO2 18*  --  18* 18* 17* 18*  GLUCOSE 123*  --  92 92 95 88  BUN 78*  --  67* 62* 59* 57*  CREATININE 2.79*  --  2.33* 2.19* 2.14* 2.04*  CALCIUM 8.2*  --  8.4* 8.8* 8.6* 8.8*  MG  --  3.1*  --   --   --   --   PHOS  --  4.1  --   --   --   --    Liver Function Tests: Recent Labs  Lab 04/28/24 1958  AST 41  ALT 15  ALKPHOS 86  BILITOT 0.3  PROT 7.8  ALBUMIN 3.9   CBG: Recent Labs  Lab 05/01/24 0022 05/01/24 0418 05/01/24 0753 05/01/24 1148  GLUCAP 79 82 [phone] 83    Discharge time spent: greater than 30 minutes.  Signed: Alban Pepper, MD [facility] 05/11/2024

## 2024-05-17 NOTE — Progress Notes (Signed)
Daily Progress Note   Patient Name: Holly Rocha       Date: 05/13/2024 DOB: [birth year]  Age: 73 y.o. MRN#: [id number] Attending Physician: Franchot Novel, MD Primary Care Physician: Diedra Lame, MD Admit Date: 04/28/2024  Reason for Consultation/Follow-up: Establishing goals of care  Subjective: Notes and labs reviewed.  In to see patient.  Daughter Jon is at bedside along with another family member.  They discuss patient's status and current planning.  They discuss she opened her eyes once today.  Nurse states she clenched her teeth with trying to provide mouth care.  Patient is in bed at this time, eyes closed.  Tachypnea noted, and congested breathing noted with standing beside patient.  PRN Robinul dose administered around an hour ago; additional one-time order placed.  Patient has scopolamine patch in place.  Nurse providing IV Dilaudid for tachypnea.  Discussed with family at bedside.  Discussed recommendations for hospice facility placement.  They advise they are amenable to this plan.  Primary team, nursing, TOC, hospice liaison updated.  Length of Stay: 5  Current Medications: Scheduled Meds:   antiseptic oral rinse  15 mL Topical BID   scopolamine  1 patch Transdermal Q72H    Continuous Infusions:   PRN Meds: acetaminophen **OR** acetaminophen, artificial tears, bisacodyl, diphenhydrAMINE, glycopyrrolate **OR** glycopyrrolate **OR** glycopyrrolate, haloperidol **OR** haloperidol **OR** haloperidol lactate, HYDROmorphone (DILAUDID) injection, LORazepam **OR** LORazepam **OR** LORazepam, ondansetron **OR** ondansetron (ZOFRAN) IV, oxyCODONE **OR** oxyCODONE  Physical Exam Constitutional:      Comments: Eyes closed  Pulmonary:     Comments: Tachypnea, and  congested breathing noted with standing beside patient Skin:    General: Skin is warm and dry.             Vital Signs: BP (!) 126/48 (BP Location: Right Arm)   Pulse (!) 106   Temp 100.1 F (37.8 C) (Oral)   Resp (!) 22   Ht 5' 7 (1.702 m)   Wt 64.2 kg   SpO2 92%   BMI 22.17 kg/m  SpO2: SpO2: 92 % O2 Device: O2 Device: Nasal Cannula O2 Flow Rate: O2 Flow Rate (L/min): 2 L/min  Intake/output summary:  Intake/Output Summary (Last 24 hours) at 05/10/2024 1215 Last data filed at 04/21/2024 0502 Gross per 24 hour  Intake --  Output 750 ml  Net -  750 ml   LBM: Last BM Date : 04/28/24 Baseline Weight: Weight: 65.8 kg Most recent weight: Weight: 64.2 kg   Patient Active Problem List   Diagnosis Date Noted   Acute kidney injury superimposed on chronic kidney disease 04/29/2024   NSTEMI (non-ST elevated myocardial infarction) (HCC) 04/29/2024   Dyslipidemia 04/29/2024   Essential hypertension 04/29/2024   Hypernatremia 04/28/2024    Palliative Care Assessment & Plan     Recommendations/Plan: Hospice facility placement   Code Status:    Code Status Orders  (From admission, onward)           Start     Ordered   05/01/24 1446  Do not attempt resuscitation (DNR) - Comfort care  Continuous       Question Answer Comment  If patient has no pulse and is not breathing Do Not Attempt Resuscitation   In Pre-Arrest Conditions (Patient Is Breathing and Has a Pulse) Provide comfort measures. Relieve any mechanical airway obstruction. Avoid transfer unless required for comfort.   Consent: Discussion documented in EHR or advanced directives reviewed      05/01/24 1451           Code Status History     Date Active Date Inactive Code Status Order ID Comments User Context   04/28/2024 2320 05/01/2024 1445 Limited: Do not attempt resuscitation (DNR) -DNR-LIMITED -Do Not Intubate/DNI  [id number]  Mansy, Madison LABOR, MD ED      Advance Directive Documentation    Flowsheet  Row Most Recent Value  Type of Advance Directive Out of facility DNR (pink MOST or yellow form)  Pre-existing out of facility DNR order (yellow form or pink MOST form) --  MOST Form in Place? --    Camelia Lewis, NP  Please contact Palliative Medicine Team phone at [phone] for questions and concerns.

## 2024-05-17 NOTE — Progress Notes (Signed)
 Pt expired at 21:28 with family at bedside this RN pronounce death with charge nurse Arland, RN second verify. Pt was a do not attempt resuscitation (DNR) - Comfort care. Dr. MADISON Peaches notified vis secure chart. All tubes, drains and PIVs removed. Emotional support given to family accordingly.

## 2024-05-17 NOTE — Plan of Care (Signed)
  Problem: Cardiac: Goal: Ability to achieve and maintain adequate cardiovascular perfusion will improve Outcome: Progressing   Problem: Clinical Measurements: Goal: Will remain free from infection Outcome: Progressing   Problem: Elimination: Goal: Will not experience complications related to urinary retention Outcome: Progressing   Problem: Safety: Goal: Ability to remain free from injury will improve Outcome: Progressing   Problem: Pain Management: Goal: Satisfaction with pain management regimen will improve Outcome: Progressing

## 2024-05-17 NOTE — Progress Notes (Incomplete)
Progress Note   Patient: Holly Rocha FMW:[id number] DOB: [birth year] DOA: 04/28/2024     5 DOS: the patient was seen and examined on 05/02/2024   Brief hospital course: JOLEY UTECHT is a 73 y.o. African-American female with medical history significant for essential hypertension and dementia, presented to the emergency room with acute onset of altered mental status with generalized weakness.  The patient has not been eating or drinking much.  She was more lethargic with decreased responsiveness at [facility] where she resides.  She has been having diminished urine output.  No reported fever or chills.  No reported nausea or vomiting or abdominal pain.  History was obtained from her daughters.   ED Course: When she came to the ER, BP was 125/55 with otherwise normal vital signs.  Labs revealed sodium 117 and chloride more than 130 with a CO2 19 and a BUN of 107 with creatinine of 3.39.  High sensitive troponin I was 201 and later 187.  Serum osmolality was 411 and lactic acid was 1.7. CBC showed leukocytosis 12.4.  UA was positive for UTI.   EKG as reviewed by me : EKG showed sinus rhythm with a rate of 88 with LVH and T wave inversion laterally. 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   The patient was given 1 L bolus of IV normal saline, IV heparin bolus and drip, 4V aspirin, 750 mg IV Levaquin and 1 L bolus of IV lactated Ringer.  She will be admitted to a telemetry bed for further evaluation and management.  Assessment and Plan:  End of life care PMT had discussion with patient's family. They would like her to go tohospice home.    Hypernatremia, severe --Likely related to poor oral intake in the setting of advanced  dementia - S/p 1/2 nl saline. Transitioned to D5, and sodium improved.  D5 was held 11/14 as patient developed an oxygen requirement. Discussed with patient's family, and patient's family had discussions with palliative care team regarding the likelihood of a similar presentation in the near future given patient's advanced dementia and poor oral intake.  Family agreed to transition patient to comfort care.  Hyperkalemia  Resolved on a.m. labs  Hyperchloremia Likely in setting of severe hypernatremia.  Remained elevated.  Monitoring was stopped due to transition to comfort care  Non ischemic myocardial injury [facility]) --Unable to verbalize symptoms, troponin 201>187, EKG without evidence of acute ischemia  - TTE LVEF 60 to 65%, no RWMA, mod LVH, G1dd, RV sys fxn is nl, mild AR/AS - Patient was started on IV heparin - Cardiology consulted, recommended medical management  Normocytic anemia  Per patient's outpt provider hgb is ~9.8-11.  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  Will stop monitoring as patient has been transition to comfort  Essential hypertension Will discontinue antihypertensives given transition to comfort  Dyslipidemia Will continue to hold for now  Possible UTI UA with >50 WBC, and many bacteria also noted to have 11-20 squamous epithelial cells.  She is s/p 1x dose of levaquin.  -Will continue to hold on further treatment   L laterla hematoma Chornic. Appears stable. Hgb stable.  Metabolic acidosis Stable this AM. VBG with mildly decreased pH. Will hold on further monitoring given transition to comfort.   Advanced dementia Patient in memory care facility since 2021. Has had progressive decline since then. Is not verbal and not able to perform any iadls/adls independently.   Patient now transitioned to comfort care.     {Tip this will not be part of the note when signed Body mass index is 22.17 kg/m. , ,  (Optional):26781}  Subjective: Patient is nonverbal and resting comfortably  Physical Exam: Vitals:   05/01/24 0737 05/01/24 1139 05/02/24 0807 04/19/2024 0821  BP: (!) 132/57 (!) 121/57 (!) 109/56 (!) 126/48  Pulse: 97 (!) 133 (!) 103 (!) 106  Resp: 18 20 20  (!) 22  Temp: (!) 100.8 F (38.2 C) (!) 101.3 F (38.5 C) (!) 100.4 F (38 C) 100.1 F (37.8 C)  TempSrc: Oral Axillary Axillary Oral  SpO2: (!) 53% 95% 99% 92%  Weight:      Height:        Physical Exam  In no distress.  Pulmonary: Non labored breathing on [place].   Abdominal: Soft. Non distended      Data Reviewed:     Latest Ref Rng & Units 05/01/2024    2:21 AM 04/30/2024   11:11 PM 04/30/2024    7:40 PM  BMP  Glucose 70 - 99 mg/dL 88  95  92   BUN 8 - 23 mg/dL 57  59  62   Creatinine 0.44 - 1.00 mg/dL 7.95  7.85  7.80   Sodium 135 - 145 mmol/L 157  157  156   Potassium 3.5 - 5.1 mmol/L 5.0  4.7  4.9   Chloride 98 - 111 mmol/L >130  130  129   CO2 22 - 32 mmol/L 18  17  18    Calcium 8.9 - 10.3 mg/dL 8.8  8.6  8.8       Latest Ref Rng & Units 05/01/2024    2:21 AM 04/30/2024    4:51 AM 04/29/2024    1:28 PM  CBC  WBC 4.0 - 10.5 K/uL 8.1  9.8  9.9   Hemoglobin 12.0 - 15.0 g/dL 9.3  9.4  9.6   Hematocrit 36.0 - 46.0 % 30.7  32.6  32.7   Platelets 150 - 400 K/uL 249  202  270      Family Communication: daughters   Disposition: Status is: Inpatient Remains inpatient appropriate because: trasnition to comfort   Planned Discharge Destination: memory care facility,  {Tip this will not be part of the note when signed  DVT Prophylaxis  .,  (Optional):26781}   Time spent: 25 minutes  Author: Alban Pepper, MD 04/30/2024 3:56 PM  For on call review [email or web address].

## 2024-05-17 NOTE — Progress Notes (Signed)
 Nutrition Brief Note  Chart reviewed. Pt now transitioning to comfort care.  No further nutrition interventions planned at this time.  Please re-consult as needed.   Margery ORN, RD, LDN, CDCES Registered Dietitian III Certified Diabetes Care and Education Specialist If unable to reach this RD, please use RD Inpatient group chat on secure chat between hours of 8am-4 pm daily

## 2024-05-17 DEATH — deceased
